# Patient Record
Sex: Female | Born: 1935 | Race: Black or African American | Hispanic: No | Marital: Single | Smoking: Never smoker
Health system: Southern US, Community
[De-identification: ages and names within clinical notes are randomized; demographics above are authoritative.]

## PROBLEM LIST (undated history)

## (undated) DIAGNOSIS — I4891 Unspecified atrial fibrillation: Secondary | ICD-10-CM

## (undated) DIAGNOSIS — N289 Disorder of kidney and ureter, unspecified: Secondary | ICD-10-CM

## (undated) DIAGNOSIS — K219 Gastro-esophageal reflux disease without esophagitis: Secondary | ICD-10-CM

## (undated) DIAGNOSIS — F330 Major depressive disorder, recurrent, mild: Secondary | ICD-10-CM

## (undated) DIAGNOSIS — I10 Essential (primary) hypertension: Secondary | ICD-10-CM

## (undated) DIAGNOSIS — I509 Heart failure, unspecified: Secondary | ICD-10-CM

## (undated) DIAGNOSIS — M1990 Unspecified osteoarthritis, unspecified site: Secondary | ICD-10-CM

## (undated) HISTORY — PX: REPLACEMENT TOTAL KNEE: SUR1224

## (undated) HISTORY — DX: Gastro-esophageal reflux disease without esophagitis: K21.9

## (undated) HISTORY — PX: EYE SURGERY: SHX253

## (undated) HISTORY — DX: Major depressive disorder, recurrent, mild: F33.0

## (undated) HISTORY — PX: TOTAL HIP ARTHROPLASTY: SHX124

## (undated) HISTORY — PX: JOINT REPLACEMENT: SHX530

## (undated) HISTORY — PX: ABDOMINAL HYSTERECTOMY: SHX81

---

## 2005-09-27 ENCOUNTER — Ambulatory Visit (HOSPITAL_COMMUNITY): Admission: RE | Admit: 2005-09-27 | Discharge: 2005-09-28 | Payer: Self-pay | Admitting: Ophthalmology

## 2011-07-18 ENCOUNTER — Inpatient Hospital Stay (HOSPITAL_COMMUNITY)
Admission: EM | Admit: 2011-07-18 | Discharge: 2011-07-22 | DRG: 291 | Disposition: A | Discharge status: Skilled Nursing Facility | Payer: PRIVATE HEALTH INSURANCE | Attending: Internal Medicine | Admitting: Internal Medicine

## 2011-07-18 ENCOUNTER — Encounter (HOSPITAL_COMMUNITY): Payer: Self-pay | Admitting: Emergency Medicine

## 2011-07-18 ENCOUNTER — Emergency Department (HOSPITAL_COMMUNITY): Payer: PRIVATE HEALTH INSURANCE

## 2011-07-18 ENCOUNTER — Other Ambulatory Visit: Payer: Self-pay

## 2011-07-18 DIAGNOSIS — Z96659 Presence of unspecified artificial knee joint: Secondary | ICD-10-CM

## 2011-07-18 DIAGNOSIS — J9801 Acute bronchospasm: Secondary | ICD-10-CM

## 2011-07-18 DIAGNOSIS — J9600 Acute respiratory failure, unspecified whether with hypoxia or hypercapnia: Secondary | ICD-10-CM | POA: Diagnosis present

## 2011-07-18 DIAGNOSIS — R0602 Shortness of breath: Secondary | ICD-10-CM | POA: Diagnosis present

## 2011-07-18 DIAGNOSIS — I4891 Unspecified atrial fibrillation: Secondary | ICD-10-CM | POA: Diagnosis present

## 2011-07-18 DIAGNOSIS — H5440 Blindness, one eye, unspecified eye: Secondary | ICD-10-CM | POA: Diagnosis present

## 2011-07-18 DIAGNOSIS — Z96649 Presence of unspecified artificial hip joint: Secondary | ICD-10-CM

## 2011-07-18 DIAGNOSIS — I509 Heart failure, unspecified: Secondary | ICD-10-CM | POA: Diagnosis present

## 2011-07-18 DIAGNOSIS — I5031 Acute diastolic (congestive) heart failure: Principal | ICD-10-CM | POA: Diagnosis present

## 2011-07-18 DIAGNOSIS — R0600 Dyspnea, unspecified: Secondary | ICD-10-CM

## 2011-07-18 DIAGNOSIS — F068 Other specified mental disorders due to known physiological condition: Secondary | ICD-10-CM | POA: Diagnosis present

## 2011-07-18 DIAGNOSIS — I10 Essential (primary) hypertension: Secondary | ICD-10-CM | POA: Diagnosis present

## 2011-07-18 HISTORY — DX: Unspecified osteoarthritis, unspecified site: M19.90

## 2011-07-18 HISTORY — DX: Essential (primary) hypertension: I10

## 2011-07-18 HISTORY — DX: Unspecified atrial fibrillation: I48.91

## 2011-07-18 HISTORY — DX: Heart failure, unspecified: I50.9

## 2011-07-18 HISTORY — DX: Disorder of kidney and ureter, unspecified: N28.9

## 2011-07-18 LAB — POCT I-STAT TROPONIN I: Troponin i, poc: 0.01 ng/mL (ref 0.00–0.08)

## 2011-07-18 LAB — DIFFERENTIAL
Eosinophils Relative: 6 % — ABNORMAL HIGH (ref 0–5)
Lymphocytes Relative: 20 % (ref 12–46)
Monocytes Absolute: 0.7 10*3/uL (ref 0.1–1.0)
Neutro Abs: 5 10*3/uL (ref 1.7–7.7)
Neutrophils Relative %: 65 % (ref 43–77)

## 2011-07-18 LAB — CBC
MCHC: 33.5 g/dL (ref 30.0–36.0)
MCV: 85.5 fL (ref 78.0–100.0)
RBC: 4.08 MIL/uL (ref 3.87–5.11)
WBC: 7.7 10*3/uL (ref 4.0–10.5)

## 2011-07-18 LAB — COMPREHENSIVE METABOLIC PANEL
AST: 18 U/L (ref 0–37)
Albumin: 3.7 g/dL (ref 3.5–5.2)
BUN: 16 mg/dL (ref 6–23)
GFR calc Af Amer: 62 mL/min — ABNORMAL LOW (ref >90)
Sodium: 133 mEq/L — ABNORMAL LOW (ref 135–145)
Total Protein: 7.7 g/dL (ref 6.0–8.3)

## 2011-07-18 LAB — URINALYSIS, ROUTINE W REFLEX MICROSCOPIC
Bilirubin Urine: NEGATIVE
Glucose, UA: NEGATIVE mg/dL
Leukocytes, UA: NEGATIVE
Urobilinogen, UA: 0.2 mg/dL (ref 0.0–1.0)

## 2011-07-18 LAB — URINE MICROSCOPIC-ADD ON

## 2011-07-18 LAB — PRO B NATRIURETIC PEPTIDE: Pro B Natriuretic peptide (BNP): 613.1 pg/mL — ABNORMAL HIGH (ref 0–450)

## 2011-07-18 MED ORDER — ALBUTEROL (5 MG/ML) CONTINUOUS INHALATION SOLN
10.0000 mg/h | INHALATION_SOLUTION | RESPIRATORY_TRACT | Status: AC
  Administered 2011-07-18: 10 mg/h via RESPIRATORY_TRACT

## 2011-07-18 MED ORDER — IPRATROPIUM BROMIDE 0.02 % IN SOLN
0.5000 mg | Freq: Once | RESPIRATORY_TRACT | Status: AC
  Administered 2011-07-18: 0.5 mg via RESPIRATORY_TRACT

## 2011-07-18 NOTE — ED Notes (Signed)
Pt sent from [facility]. Called EMS to state pt had chest pain. NH gave pt ASA 325mg  and NTG SL .5 x1. Pt tachypnec pon EMS arrival. Pt c/o chest pain to mid sternal area, non-radiating. Rated pain 6/10. Skin warm, dry. Denies nausea. Productive cough on scene with thick, clear mucous. Lung sounds diminished upper bilat and absent in lower bilat. Pt given albuterol 5mg  neb at 1930 and then given second neb of alb 5mg  and atrovent. Pt given Solumedrol 125mg  per EMS. Pt a/o per EMS.

## 2011-07-18 NOTE — ED Provider Notes (Signed)
History     CSN: [id number]  Arrival date & time 07/18/11  2016   First MD Initiated Contact with Patient 07/18/11 2020       Chief Complaint  Patient presents with  . Respiratory Distress   Level V caveat for dementia  (Consider location/radiation/quality/duration/timing/severity/associated sxs/prior treatment) HPI  History obtained from EMS. They relate they were called for patient having chest pain she was noted to have some respiratory distress. They relate to her initial pulse ox was 90% on room air. They also noted her initial blood pressure was 220/100 and time. They were told patient has been having a cough and they've started given her albuterol nebulizer treatments which looking at her sheet from the nursing home was started at least by February 1. She received albuterol nebulizer at the facility and the EMS gave her 2 treatments with Atrovent. She was also given Solu-Medrol 125 mg IV, aspirin 324 mg by mouth and nitroglycerin once. They relate patient has a history of atrial fib no known heart problems or COPD or asthma. He also denies history of congestive heart failure.  Patient had a chest x-ray done on January 18 at the nursing home that was read as showing atelectasis.  PCP Dr. Charlotte Sanes, [facility]  Past Medical History  Diagnosis Date  . Hypertension   . Atrial fibrillation   . Renal disorder    dementia Blind in left eye  History reviewed. No pertinent past surgical history.  No family history on file.  History  Substance Use Topics  . Smoking status: No  . Smokeless tobacco: Not on file  . Alcohol Use: no   patient lives in nursing home, looking at her records she was living at home until December 18  OB History    Grav Para Term Preterm Abortions TAB SAB Ect Mult Living                  Review of Systems  Unable to perform ROS: Dementia    Allergies  Review of patient's allergies indicates no known allergies.  Home Medications    Current Outpatient Rx  Name Route Sig Dispense Refill  . ACETAMINOPHEN 325 MG PO TABS Oral Take 650 mg by mouth every 6 (six) hours as needed. Pain    . ALBUTEROL SULFATE (2.5 MG/3ML) 0.083% IN NEBU Nebulization Take 2.5 mg by nebulization every 6 (six) hours as needed. wheezing    . ALUM & MAG HYDROXIDE-SIMETH 400-400-40 MG/5ML PO SUSP Oral Take 30 mLs by mouth every 6 (six) hours as needed.    . ASPIRIN 325 MG PO TABS Oral Take 325 mg by mouth daily.    Marland Kitchen FLUTICASONE PROPIONATE 50 MCG/ACT NA SUSP Nasal Place 2 sprays into the nose daily.    . GUAIFENESIN-DM 100-10 MG/5ML PO SYRP Oral Take 15 mLs by mouth 3 (three) times daily as needed. Cold symptoms    . LORATADINE 10 MG PO TABS Oral Take 10 mg by mouth daily.    . MELOXICAM 15 MG PO TABS Oral Take 7.5 mg by mouth every 12 (twelve) hours as needed. Pain    . MELOXICAM 15 MG PO TABS Oral Take 15 mg by mouth daily.    Marland Kitchen METOPROLOL TARTRATE 100 MG PO TABS Oral Take 100 mg by mouth daily.    Marland Kitchen OMEPRAZOLE 20 MG PO CPDR Oral Take 20 mg by mouth daily.    Marland Kitchen PROMETHAZINE HCL 25 MG PO TABS Oral Take 25 mg by mouth every  6 (six) hours as needed. Nausea    . TIMOLOL HEMIHYDRATE 0.25 % OP SOLN Right Eye Place 1 drop into the right eye 2 (two) times daily.      BP 158/103  Pulse 86  Temp(Src) 97.5 F (36.4 C) (Core (Comment))  Resp 22  SpO2 98%  Vital signs normal  except hypertension   Physical Exam  Nursing note and vitals reviewed. Constitutional: She is oriented to person, place, and time. She appears well-developed and well-nourished.  Non-toxic appearance. She does not appear ill. She appears distressed.  HENT:  Head: Normocephalic and atraumatic.  Right Ear: External ear normal.  Left Ear: External ear normal.  Nose: Nose normal. No mucosal edema or rhinorrhea.  Mouth/Throat: Oropharynx is clear and moist and mucous membranes are normal. No dental abscesses or uvula swelling.  Eyes: Conjunctivae and EOM are normal. Pupils are  equal, round, and reactive to light.       It eye appears to be something compared to the right. Eye   Neck: Normal range of motion and full passive range of motion without pain. Neck supple.  Cardiovascular: Normal rate, regular rhythm and normal heart sounds.  Exam reveals no gallop and no friction rub.   No murmur heard. Pulmonary/Chest: She is in respiratory distress. She has wheezes. She has no rhonchi. She has no rales. She exhibits no tenderness and no crepitus.       Patient has retractions, she is noted to have diffuse wheezing, she has some cough. Patient indicates she is having pain in her left lower chest wall.  Abdominal: Soft. Normal appearance and bowel sounds are normal. She exhibits no distension. There is no tenderness. There is no rebound and no guarding.  Musculoskeletal: Normal range of motion. She exhibits no edema and no tenderness.       Moves all extremities well.   Neurological: She is alert and oriented to person, place, and time. She has normal strength. No cranial nerve deficit.  Skin: Skin is warm, dry and intact. No rash noted. No erythema. No pallor.  Psychiatric: Her speech is normal and behavior is normal. Her mood appears not anxious.       Affect flat    ED Course  Procedures (including critical care time)  She was placed on BiPAP and started on a continuous nebulizer. She relates she's feeling better. Family has arrived and states she's had a bad cough for the past month. She was living at home but had a fall and has been in rehabilitation since middle of December. She reports that the patient has not been eating or drinking well since she went to the nursing home. Family states patient never smoked. They report she's never had any respiratory problems in the past.  Recheck at 0005 patient has been off BiPAP and is tolerating being off it well. She still has some scattered wheezing however her retractions are much improved.   Medications  albuterol  (PROVENTIL,VENTOLIN) solution continuous neb (10 mg/hr Nebulization New Bag/Given 07/18/11 2038)  furosemide (LASIX) injection 60 mg (not administered)  nitroGLYCERIN (NITROGLYN) 2 % ointment 1 inch (not administered)  ipratropium (ATROVENT) nebulizer solution 0.5 mg (0.5 mg Nebulization Given 07/18/11 2038)  albuterol (PROVENTIL) (5 MG/ML) 0.5% nebulizer solution 5 mg (5 mg Nebulization Given 07/19/11 0016)  ipratropium (ATROVENT) nebulizer solution 0.5 mg (0.5 mg Nebulization Given 07/19/11 0016)    00:22 Dr Conley Rolls admit to tele, team 2 give lasix for CHF   Results for orders placed during  the hospital encounter of 07/18/11  CBC      Component Value Range   WBC 7.7  4.0 - 10.5 (K/uL)   RBC 4.08  3.87 - 5.11 (MIL/uL)   Hemoglobin 11.7 (*) 12.0 - 15.0 (g/dL)   HCT 86.5 (*) 78.4 - 46.0 (%)   MCV 85.5  78.0 - 100.0 (fL)   MCH 28.7  26.0 - 34.0 (pg)   MCHC 33.5  30.0 - 36.0 (g/dL)   RDW 69.6  29.5 - 28.4 (%)   Platelets 243  150 - 400 (K/uL)  DIFFERENTIAL      Component Value Range   Neutrophils Relative 65  43 - 77 (%)   Neutro Abs 5.0  1.7 - 7.7 (K/uL)   Lymphocytes Relative 20  12 - 46 (%)   Lymphs Abs 1.6  0.7 - 4.0 (K/uL)   Monocytes Relative 9  3 - 12 (%)   Monocytes Absolute 0.7  0.1 - 1.0 (K/uL)   Eosinophils Relative 6 (*) 0 - 5 (%)   Eosinophils Absolute 0.4  0.0 - 0.7 (K/uL)   Basophils Relative 0  0 - 1 (%)   Basophils Absolute 0.0  0.0 - 0.1 (K/uL)  COMPREHENSIVE METABOLIC PANEL      Component Value Range   Sodium 133 (*) 135 - 145 (mEq/L)   Potassium 4.0  3.5 - 5.1 (mEq/L)   Chloride 98  96 - 112 (mEq/L)   CO2 26  19 - 32 (mEq/L)   Glucose, Bld 124 (*) 70 - 99 (mg/dL)   BUN 16  6 - 23 (mg/dL)   Creatinine, Ser 1.32  0.50 - 1.10 (mg/dL)   Calcium 9.6  8.4 - 44.0 (mg/dL)   Total Protein 7.7  6.0 - 8.3 (g/dL)   Albumin 3.7  3.5 - 5.2 (g/dL)   AST 18  0 - 37 (U/L)   ALT 16  0 - 35 (U/L)   Alkaline Phosphatase 74  39 - 117 (U/L)   Total Bilirubin 0.2 (*) 0.3 - 1.2  (mg/dL)   GFR calc non Af Amer 53 (*) >90 (mL/min)   GFR calc Af Amer 62 (*) >90 (mL/min)  PRO B NATRIURETIC PEPTIDE      Component Value Range   Pro B Natriuretic peptide (BNP) 613.1 (*) 0 - 450 (pg/mL)  URINALYSIS, ROUTINE W REFLEX MICROSCOPIC      Component Value Range   Color, Urine YELLOW  YELLOW    APPearance CLEAR  CLEAR    Specific Gravity, Urine 1.018  1.005 - 1.030    pH 5.5  5.0 - 8.0    Glucose, UA NEGATIVE  NEGATIVE (mg/dL)   Hgb urine dipstick NEGATIVE  NEGATIVE    Bilirubin Urine NEGATIVE  NEGATIVE    Ketones, ur NEGATIVE  NEGATIVE (mg/dL)   Protein, ur 30 (*) NEGATIVE (mg/dL)   Urobilinogen, UA 0.2  0.0 - 1.0 (mg/dL)   Nitrite NEGATIVE  NEGATIVE    Leukocytes, UA NEGATIVE  NEGATIVE   POCT I-STAT TROPONIN I      Component Value Range   Troponin i, poc 0.01  0.00 - 0.08 (ng/mL)   Comment 3           URINE MICROSCOPIC-ADD ON      Component Value Range   Squamous Epithelial / LPF RARE  RARE    WBC, UA 0-2  <3 (WBC/hpf)   RBC / HPF 0-2  <3 (RBC/hpf)   Bacteria, UA RARE  RARE    Laboratory interpretation  all normal except  mildly elevated BNP, renal insufficiency, hyponatremia  Dg Chest Port 1 View  07/18/2011  *RADIOLOGY REPORT*  Clinical Data: Shortness of breath and weakness.  PORTABLE CHEST - 1 VIEW  Comparison: Chest radiograph performed 09/27/2005  Findings: The lungs are hypoexpanded; vascular crowding and vascular congestion are noted.  Mildly increased interstitial markings could reflect minimal interstitial edema, given the patient's symptoms.  There is no evidence of pleural effusion or pneumothorax.  The cardiomediastinal silhouette is borderline normal in caliber. No acute osseous abnormalities are seen.  IMPRESSION: Lungs hypoexpanded; vascular congestion noted.  Mildly increased interstitial markings could reflect minimal interstitial edema, given the patient's symptoms.  Original Report Authenticated By: Tonia Ghent, M.D.     Date: 07/18/2011  Rate:  81  Rhythm: atrial fibrillation  QRS Axis: normal  Intervals: normal  ST/T Wave abnormalities: normal  Conduction Disutrbances:none  Narrative Interpretation:   Old EKG Reviewed: changes noted from 09/27/2005 SB 45    1. Dyspnea   2. Congestive heart failure   3. Bronchospasm   4. Chest pain     Plan admission  Devoria Albe, MD, FACEP  CRITICAL CARE Performed by: Devoria Albe L   Total critical care time:45 min  Critical care time was exclusive of separately billable procedures and treating other patients.  Critical care was necessary to treat or prevent imminent or life-threatening deterioration.  Critical care was time spent personally by me on the following activities: development of treatment plan with patient and/or surrogate as well as nursing, discussions with consultants, evaluation of patient's response to treatment, examination of patient, obtaining history from patient or surrogate, ordering and performing treatments and interventions, ordering and review of laboratory studies, ordering and review of radiographic studies, pulse oximetry and re-evaluation of patient's condition.   MDM          Ward Givens, MD 07/19/11 [phone]

## 2011-07-18 NOTE — ED Notes (Signed)
RT at bedside. Pt placed on Bi-Pap. Pt tolerating well.

## 2011-07-18 NOTE — ED Notes (Signed)
MD at bedside. Dr. Knapp at bedside.  

## 2011-07-18 NOTE — ED Notes (Signed)
RT called to take pt off Bi-Pap and switch to nasal cannula.

## 2011-07-18 NOTE — ED Notes (Signed)
QMV:HQIO<NG> Expected date:07/18/11<BR> Expected time: 7:57 PM<BR> Means of arrival:Ambulance<BR> Comments:<BR> Respiratory distress, needs respiratory therapy, CHF patient

## 2011-07-19 ENCOUNTER — Encounter (HOSPITAL_COMMUNITY): Payer: Self-pay | Admitting: Neurology

## 2011-07-19 DIAGNOSIS — I359 Nonrheumatic aortic valve disorder, unspecified: Secondary | ICD-10-CM

## 2011-07-19 DIAGNOSIS — I509 Heart failure, unspecified: Secondary | ICD-10-CM | POA: Diagnosis present

## 2011-07-19 LAB — BASIC METABOLIC PANEL
Calcium: 9 mg/dL (ref 8.4–10.5)
Chloride: 95 mEq/L — ABNORMAL LOW (ref 96–112)
Creatinine, Ser: 0.93 mg/dL (ref 0.50–1.10)
GFR calc Af Amer: 63 mL/min — ABNORMAL LOW (ref >90)
Sodium: 133 mEq/L — ABNORMAL LOW (ref 135–145)

## 2011-07-19 LAB — URINE CULTURE

## 2011-07-19 MED ORDER — ENOXAPARIN SODIUM 40 MG/0.4ML ~~LOC~~ SOLN
40.0000 mg | SUBCUTANEOUS | Status: DC
  Administered 2011-07-19 – 2011-07-22 (×4): 40 mg via SUBCUTANEOUS
  Filled 2011-07-19 (×4): qty 0.4

## 2011-07-19 MED ORDER — METOPROLOL TARTRATE 100 MG PO TABS
100.0000 mg | ORAL_TABLET | Freq: Every day | ORAL | Status: DC
  Administered 2011-07-19 – 2011-07-20 (×2): 100 mg via ORAL
  Filled 2011-07-19: qty 4
  Filled 2011-07-19: qty 1

## 2011-07-19 MED ORDER — TIMOLOL MALEATE 0.25 % OP SOLN
1.0000 [drp] | Freq: Two times a day (BID) | OPHTHALMIC | Status: DC
  Administered 2011-07-19 – 2011-07-22 (×7): 1 [drp] via OPHTHALMIC
  Filled 2011-07-19: qty 5

## 2011-07-19 MED ORDER — ALUM & MAG HYDROXIDE-SIMETH 200-200-20 MG/5ML PO SUSP: 30.0000 mL | Freq: Four times a day (QID) | ORAL | Status: DC | PRN

## 2011-07-19 MED ORDER — GUAIFENESIN-DM 100-10 MG/5ML PO SYRP
10.0000 mL | ORAL_SOLUTION | Freq: Four times a day (QID) | ORAL | Status: DC | PRN
  Administered 2011-07-19 – 2011-07-22 (×5): 10 mL via ORAL
  Filled 2011-07-19 (×5): qty 10

## 2011-07-19 MED ORDER — ALBUTEROL SULFATE (5 MG/ML) 0.5% IN NEBU
5.0000 mg | INHALATION_SOLUTION | Freq: Once | RESPIRATORY_TRACT | Status: AC
  Administered 2011-07-19: 5 mg via RESPIRATORY_TRACT
  Filled 2011-07-19: qty 0.5

## 2011-07-19 MED ORDER — ACETAMINOPHEN 325 MG PO TABS
650.0000 mg | ORAL_TABLET | Freq: Four times a day (QID) | ORAL | Status: DC | PRN
  Administered 2011-07-19 – 2011-07-20 (×2): 650 mg via ORAL
  Filled 2011-07-19 (×2): qty 2

## 2011-07-19 MED ORDER — PANTOPRAZOLE SODIUM 40 MG PO TBEC
40.0000 mg | DELAYED_RELEASE_TABLET | Freq: Every day | ORAL | Status: DC
  Administered 2011-07-19 – 2011-07-22 (×4): 40 mg via ORAL
  Filled 2011-07-19 (×4): qty 1

## 2011-07-19 MED ORDER — ASPIRIN 325 MG PO TABS
325.0000 mg | ORAL_TABLET | Freq: Every day | ORAL | Status: DC
  Administered 2011-07-19 – 2011-07-22 (×4): 325 mg via ORAL
  Filled 2011-07-19 (×4): qty 1

## 2011-07-19 MED ORDER — POTASSIUM CHLORIDE CRYS ER 20 MEQ PO TBCR
20.0000 meq | EXTENDED_RELEASE_TABLET | Freq: Every day | ORAL | Status: DC
  Administered 2011-07-19 – 2011-07-22 (×4): 20 meq via ORAL
  Filled 2011-07-19 (×4): qty 1

## 2011-07-19 MED ORDER — ALBUTEROL SULFATE (5 MG/ML) 0.5% IN NEBU
2.5000 mg | INHALATION_SOLUTION | RESPIRATORY_TRACT | Status: DC | PRN
  Administered 2011-07-19 (×2): 2.5 mg via RESPIRATORY_TRACT
  Filled 2011-07-19 (×3): qty 0.5

## 2011-07-19 MED ORDER — IPRATROPIUM BROMIDE 0.02 % IN SOLN
0.5000 mg | Freq: Once | RESPIRATORY_TRACT | Status: AC
  Administered 2011-07-19: 0.5 mg via RESPIRATORY_TRACT
  Filled 2011-07-19: qty 2.5

## 2011-07-19 MED ORDER — FUROSEMIDE 10 MG/ML IJ SOLN
60.0000 mg | Freq: Once | INTRAMUSCULAR | Status: AC
  Administered 2011-07-19: 60 mg via INTRAVENOUS
  Filled 2011-07-19: qty 6

## 2011-07-19 MED ORDER — NITROGLYCERIN 2 % TD OINT
1.0000 [in_us] | TOPICAL_OINTMENT | Freq: Once | TRANSDERMAL | Status: AC
  Administered 2011-07-19: 1 [in_us] via TOPICAL
  Filled 2011-07-19: qty 30

## 2011-07-19 MED ORDER — LISINOPRIL 5 MG PO TABS
5.0000 mg | ORAL_TABLET | Freq: Every day | ORAL | Status: DC
  Administered 2011-07-19 – 2011-07-22 (×4): 5 mg via ORAL
  Filled 2011-07-19 (×4): qty 1

## 2011-07-19 MED ORDER — FUROSEMIDE 10 MG/ML IJ SOLN
40.0000 mg | Freq: Every day | INTRAMUSCULAR | Status: DC
  Administered 2011-07-19 – 2011-07-20 (×2): 40 mg via INTRAVENOUS
  Filled 2011-07-19 (×2): qty 4

## 2011-07-19 NOTE — ED Notes (Signed)
MD at bedside. Dr. Knapp at bedside.  

## 2011-07-19 NOTE — ED Notes (Signed)
RT called for breathing tx. 

## 2011-07-19 NOTE — Plan of Care (Signed)
Problem: Phase I Progression Outcomes Goal: EF % per last Echo/documented,Core Reminder form on chart Outcome: Progressing EF 60% per echo on [address]113

## 2011-07-19 NOTE — Progress Notes (Signed)
PROGRESS NOTE  Shelby Maynard ZOX:[id number] DOB: [birth year] DOA: 07/18/2011 PCP: No primary provider on file.  Brief narrative: 78 year old woman presented with shortness of breath. Complained of chest pain, respiratory distress. Blood pressure 220/100. Initially placed on BiPAP but quickly improved. Chest x-ray revealed some interstitial edema. Patient has been sick for one month with cough and has been treated with rounds of antibiotics and inhalers. She has been expressed a worsening cough at night with PND and orthopnea. Patient denied chest pain to the admitting physician.  Past medical history: Atrial fibrillation (has not been on Coumadin for years), hypertension, glaucoma  Consultants:  None  Procedures:  None  Antibiotics:  None  Interim History: Chart reviewed in detail.  Subjective: Feels better. Breathing better. No chest pain.  Objective: Filed Vitals:   07/19/11 0645 07/19/11 0730 07/19/11 1240 07/19/11 1600  BP: 107/55 121/79 131/80 114/76  Pulse: 85 90 85 89  Temp: 97.4 F (36.3 C) 97.6 F (36.4 C) 97.6 F (36.4 C) 98.5 F (36.9 C)  TempSrc:   Oral Oral  Resp: 8 25 20 22   SpO2: 95% 98% 96% 97%    Intake/Output Summary (Last 24 hours) at 07/19/11 1806 Last data filed at 07/19/11 1710  Gross per 24 hour  Intake    440 ml  Output   3650 ml  Net  -3210 ml    Exam:   General:  Calm and comfortable.  Cardiovascular: Irregular rhythm, normal rate. No murmur, rub, gallop. No significant lower extremity edema  Respiratory: Clear to auscultation bilaterally without wheezes, rales, rhonchi. Normal respiratory effort.  Skin: Appears grossly unremarkable.  Psychiatric: Grossly normal mood and affect. Speech fluent and appropriate.  Data Reviewed: Basic Metabolic Panel:  Lab 07/19/11 8119 07/18/11 2025  NA 133* 133*  K 4.0 4.0  CL 95* 98  CO2 22 26  GLUCOSE 90 124*  BUN 25* 16  CREATININE 0.93 0.94  CALCIUM 9.0 9.6  MG -- --  PHOS -- --    Liver Function Tests:  Lab 07/18/11 2025  AST 18  ALT 16  ALKPHOS 74  BILITOT 0.2*  PROT 7.7  ALBUMIN 3.7   CBC:  Lab 07/18/11 2025  WBC 7.7  NEUTROABS 5.0  HGB 11.7*  HCT 34.9*  MCV 85.5  PLT 243   Studies: Dg Chest Port 1 View  07/18/2011  *RADIOLOGY REPORT*  Clinical Data: Shortness of breath and weakness.  PORTABLE CHEST - 1 VIEW  Comparison: Chest radiograph performed 09/27/2005  Findings: The lungs are hypoexpanded; vascular crowding and vascular congestion are noted.  Mildly increased interstitial markings could reflect minimal interstitial edema, given the patient's symptoms.  There is no evidence of pleural effusion or pneumothorax.  The cardiomediastinal silhouette is borderline normal in caliber. No acute osseous abnormalities are seen.  IMPRESSION: Lungs hypoexpanded; vascular congestion noted.  Mildly increased interstitial markings could reflect minimal interstitial edema, given the patient's symptoms.  Original Report Authenticated By: Tonia Ghent, M.D.    Scheduled Meds:   . albuterol  5 mg Nebulization Once  . aspirin  325 mg Oral Daily  . enoxaparin (LOVENOX) injection  40 mg Subcutaneous Q24H  . furosemide  40 mg Intravenous Daily  . furosemide  60 mg Intravenous Once  . ipratropium  0.5 mg Nebulization Once  . ipratropium  0.5 mg Nebulization Once  . lisinopril  5 mg Oral Daily  . metoprolol  100 mg Oral Daily  . nitroGLYCERIN  1 inch Topical Once  . pantoprazole  40  mg Oral Q1200  . potassium chloride  20 mEq Oral Daily  . timolol  1 drop Right Eye BID   Continuous Infusions:   . albuterol Stopped (07/19/11 1610)     Assessment/Plan: 1. Acute respiratory failure: Resolved. History in response to Lasix is most suggestive of acute diastolic heart failure given cardiac imaging. 2. Acute diastolic (presumed) heart failure: Much improved. Continue Lasix. Continue beta blocker. 3. Atrial fibrillation: Rate control. Continue beta blocker. Patient  has not been on warfarin for many years. Followup as outpatient.  Code Status: Full code. Family Communication: Discussed with family at bedside including granddaughter. All questions answered to her apparent satisfaction. Disposition Plan: Return to skilled nursing facility when improved, possibly 24-48 hours.   Brendia Sacks, MD  [facility] Pager [phone] 07/19/2011, 6:06 PM    LOS: 1 day

## 2011-07-19 NOTE — Progress Notes (Signed)
*  PRELIMINARY RESULTS* Echocardiogram 2D Echocardiogram has been performed.  Glean Salen [facility] 07/19/2011, 9:58 AM

## 2011-07-19 NOTE — H&P (Signed)
Shelby Maynard is an 78 y.o. female with a PMHX significant for Afib with controlled rate, HTN, blindness in OS 2/2 glaucoma, DJD, and recent fall. She has not been on coumadin in years.   Chief Complaint: respiratory difficulty, cough, SOB x one month HPI: Shelby Maynard presented via EMS to [facility] ED tonight after experiencing significant SOB with respiratory distress at the rehab facility where she is residing short term after a fall in Dec 2012. Upon arrival, pt was given neb tx and placed on Bipap with relief of her respiratory distress. She is presently tolerating being off bipap. She was also given Solumedrol IV, NTG, and ASA 325 x 1. Troponin neg in ED. EKG showed Afib with normal rate and no acute changes. CXR with increased markings and vascular congestion with ? Interstitial edema. Other labs and MD ED note reviewed.  Hx and ROS per pt and daughter/granddaughter who are at bedside. Pt/family relate that pt has had a cough with production of clear "phlegm" for one month at the rehab facility. She has had abx, inhalers and nebs without relief of her sx. She had a CXR on 06/15/11 which showed atelectasis. Pt says she has been experiencing worsening cough at night with PND and orthopnea worsening over the past week or so. Family asked for pt to be sent here for evaluation due to the length of acute illness. Pt denies fever, chills, vomiting, chest pain, abd pain, or change in mental status. She admits to some nausea and heartburn lately and says her appetite hasn't been that great since being at the SNF for rehab. She and family deny any history of heart problems or lung problems.  Prior to Dec 2012 and the mechanical fall, she lived alone. However, she did have outside help come in a few hours per day 5 x a week for assistance. Family rotated covering the other gaps in time. Family denies any hx of dementia and says mom is "sharp as a tack".  Pt has had debility 2/2 DJD with hx of several joint replacements. Says her  knee "gives out" sometimes b/c she was supposed to have it replaced, but elected not to do this. Pt doesn't drive.  Therefore, because of the pt's sx, we are asked to see, evaluate, and admit to hospital for treatment.  Past Medical History  Diagnosis Date  . Hypertension   . Atrial fibrillation   . Renal disorder   . Arthritis     Past Surgical History  Procedure Date  . Replacement total knee   . Total hip arthroplasty     bilateral  . Eye surgery   . Abdominal hysterectomy   . Joint replacement    Social History: Pt presently lives at [facility] for rehab after a fall in Dec 2012. Prior to that she lived alone. Remote hx of social ETOH but none in years. Never smoked. No illicit drugs.  FMHX: Father died of MI. Mother died of colon cancer. Daughters x2 died of breast cancer and lung cancer.  Allergies: No Known Allergies  Medications Prior to Admission  Medication Dose Route Frequency Provider Last Rate Last Dose  . albuterol (PROVENTIL) (5 MG/ML) 0.5% nebulizer solution 5 mg  5 mg Nebulization Once Ward Givens, MD   5 mg at 07/19/11 0016  . albuterol (PROVENTIL,VENTOLIN) solution continuous neb  10 mg/hr Nebulization Continuous Ward Givens, MD 2 mL/hr at 07/18/11 2038 10 mg/hr at 07/18/11 2038  . furosemide (LASIX) injection 60 mg  60 mg  Intravenous Once Ward Givens, MD      . ipratropium (ATROVENT) nebulizer solution 0.5 mg  0.5 mg Nebulization Once Ward Givens, MD   0.5 mg at 07/18/11 2038  . ipratropium (ATROVENT) nebulizer solution 0.5 mg  0.5 mg Nebulization Once Ward Givens, MD   0.5 mg at 07/19/11 0016  . nitroGLYCERIN (NITROGLYN) 2 % ointment 1 inch  1 inch Topical Once Ward Givens, MD       No current outpatient prescriptions on file as of 07/18/2011.    Results for orders placed during the hospital encounter of 07/18/11 (from the past 48 hour(s))  CBC     Status: Abnormal   Collection Time   07/18/11  8:25 PM      Component Value Range Comment   WBC 7.7  4.0 - 10.5  (K/uL)    RBC 4.08  3.87 - 5.11 (MIL/uL)    Hemoglobin 11.7 (*) 12.0 - 15.0 (g/dL)    HCT 16.1 (*) 09.6 - 46.0 (%)    MCV 85.5  78.0 - 100.0 (fL)    MCH 28.7  26.0 - 34.0 (pg)    MCHC 33.5  30.0 - 36.0 (g/dL)    RDW 04.5  40.9 - 81.1 (%)    Platelets 243  150 - 400 (K/uL)   DIFFERENTIAL     Status: Abnormal   Collection Time   07/18/11  8:25 PM      Component Value Range Comment   Neutrophils Relative 65  43 - 77 (%)    Neutro Abs 5.0  1.7 - 7.7 (K/uL)    Lymphocytes Relative 20  12 - 46 (%)    Lymphs Abs 1.6  0.7 - 4.0 (K/uL)    Monocytes Relative 9  3 - 12 (%)    Monocytes Absolute 0.7  0.1 - 1.0 (K/uL)    Eosinophils Relative 6 (*) 0 - 5 (%)    Eosinophils Absolute 0.4  0.0 - 0.7 (K/uL)    Basophils Relative 0  0 - 1 (%)    Basophils Absolute 0.0  0.0 - 0.1 (K/uL)   COMPREHENSIVE METABOLIC PANEL     Status: Abnormal   Collection Time   07/18/11  8:25 PM      Component Value Range Comment   Sodium 133 (*) 135 - 145 (mEq/L)    Potassium 4.0  3.5 - 5.1 (mEq/L)    Chloride 98  96 - 112 (mEq/L)    CO2 26  19 - 32 (mEq/L)    Glucose, Bld 124 (*) 70 - 99 (mg/dL)    BUN 16  6 - 23 (mg/dL)    Creatinine, Ser 9.14  0.50 - 1.10 (mg/dL)    Calcium 9.6  8.4 - 10.5 (mg/dL)    Total Protein 7.7  6.0 - 8.3 (g/dL)    Albumin 3.7  3.5 - 5.2 (g/dL)    AST 18  0 - 37 (U/L)    ALT 16  0 - 35 (U/L)    Alkaline Phosphatase 74  39 - 117 (U/L)    Total Bilirubin 0.2 (*) 0.3 - 1.2 (mg/dL)    GFR calc non Af Amer 53 (*) >90 (mL/min)    GFR calc Af Amer 62 (*) >90 (mL/min)   PRO B NATRIURETIC PEPTIDE     Status: Abnormal   Collection Time   07/18/11  8:25 PM      Component Value Range Comment   Pro B Natriuretic peptide (BNP) 613.1 (*)  0 - 450 (pg/mL)   URINALYSIS, ROUTINE W REFLEX MICROSCOPIC     Status: Abnormal   Collection Time   07/18/11  8:34 PM      Component Value Range Comment   Color, Urine YELLOW  YELLOW     APPearance CLEAR  CLEAR     Specific Gravity, Urine 1.018  1.005 - 1.030      pH 5.5  5.0 - 8.0     Glucose, UA NEGATIVE  NEGATIVE (mg/dL)    Hgb urine dipstick NEGATIVE  NEGATIVE     Bilirubin Urine NEGATIVE  NEGATIVE     Ketones, ur NEGATIVE  NEGATIVE (mg/dL)    Protein, ur 30 (*) NEGATIVE (mg/dL)    Urobilinogen, UA 0.2  0.0 - 1.0 (mg/dL)    Nitrite NEGATIVE  NEGATIVE     Leukocytes, UA NEGATIVE  NEGATIVE    URINE MICROSCOPIC-ADD ON     Status: Normal   Collection Time   07/18/11  8:34 PM      Component Value Range Comment   Squamous Epithelial / LPF RARE  RARE     WBC, UA 0-2  <3 (WBC/hpf)    RBC / HPF 0-2  <3 (RBC/hpf)    Bacteria, UA RARE  RARE    POCT I-STAT TROPONIN I     Status: Normal   Collection Time   07/18/11  8:50 PM      Component Value Range Comment   Troponin i, poc 0.01  0.00 - 0.08 (ng/mL)    Comment 3             Dg Chest Port 1 View  07/18/2011  *RADIOLOGY REPORT*  Clinical Data: Shortness of breath and weakness.  PORTABLE CHEST - 1 VIEW  Comparison: Chest radiograph performed 09/27/2005  Findings: The lungs are hypoexpanded; vascular crowding and vascular congestion are noted.  Mildly increased interstitial markings could reflect minimal interstitial edema, given the patient's symptoms.  There is no evidence of pleural effusion or pneumothorax.  The cardiomediastinal silhouette is borderline normal in caliber. No acute osseous abnormalities are seen.  IMPRESSION: Lungs hypoexpanded; vascular congestion noted.  Mildly increased interstitial markings could reflect minimal interstitial edema, given the patient's symptoms.  Original Report Authenticated By: Tonia Ghent, M.D.    Review of Systems  Constitutional: Negative for fever, chills, weight loss, malaise/fatigue and diaphoresis.  HENT: Negative for hearing loss, congestion, sore throat and ear discharge.   Eyes: Negative for double vision, pain and discharge.       Blind OS  Respiratory: Positive for cough, sputum production (clear), shortness of breath and wheezing. Negative for  hemoptysis and stridor.   Cardiovascular: Positive for orthopnea, leg swelling and PND. Negative for chest pain and palpitations.  Gastrointestinal: Positive for heartburn and nausea. Negative for vomiting, abdominal pain and diarrhea.  Genitourinary: Negative for dysuria and hematuria.  Musculoskeletal: Positive for joint pain and falls (one in December. "leg gave out").  Skin: Negative for rash.  Neurological: Positive for weakness (in knee from arthritis). Negative for dizziness, tremors, sensory change, speech change, focal weakness, seizures, loss of consciousness and headaches.  Endo/Heme/Allergies: Negative for polydipsia. Does not bruise/bleed easily.  Psychiatric/Behavioral: Negative for depression, memory loss and substance abuse. The patient is not nervous/anxious.     Blood pressure 158/103, pulse 86, temperature 97.5 F (36.4 C), temperature source Core (Comment), resp. rate 22, SpO2 98.00%. Physical Exam  Vitals reviewed. Constitutional: She is oriented to person, place, and time. She appears well-developed and well-nourished.  No distress.       obese  HENT:  Head: Normocephalic and atraumatic.  Right Ear: External ear normal.  Left Ear: External ear normal.  Nose: Nose normal.  Mouth/Throat: Oropharynx is clear and moist. No oropharyngeal exudate.  Eyes: Conjunctivae are normal. Right eye exhibits no discharge. Left eye exhibits no discharge. No scleral icterus.       OD pupil 3mm and reactive. Non reactive OS pupil 2/2 blindness.   Neck: Normal range of motion. Neck supple. No JVD present. No tracheal deviation present. No thyromegaly present.  Cardiovascular: Normal rate and intact distal pulses.  Exam reveals no gallop and no friction rub.   No murmur heard.      No carotid bruits  Respiratory: Effort normal. No stridor. No respiratory distress. She has wheezes (minor but good air exchange). She has no rales.  GI: Soft. Bowel sounds are normal. She exhibits no  distension and no mass. There is no tenderness. There is no rebound and no guarding.  Musculoskeletal: Normal range of motion.       No redness, heat or edema noted to joints  Lymphadenopathy:    She has no cervical adenopathy.  Neurological: She is alert and oriented to person, place, and time. No cranial nerve deficit.       Keeps her eyes closed some but is easily aroused and talkative  Skin: Skin is warm and dry. She is not diaphoretic. No erythema.  Psychiatric: She has a normal mood and affect. Her behavior is normal. Judgment and thought content normal.    A/P: 1. New onset CHF-We will admit her to telemetry and get an echo in the a.m.  Cardio consult if needed. Follow BNP. Lasix IV and KCL supplementation. She is already on BB and will add ACE.  2. resp distress upon arrival-resolved with Bipap. Wheezing is less. Will add nebs for comfort although wheezing is likely cardiac in origin. O2 as needed to keep sats over 92%. Pt not having chest pain and troponin was neg. Not suspicious for PE at this time.  3. HTN-BP up some. On metroprolol chronically, add ACE as in #1. Watch renal fx. 4. Afib with controlled rate-family says she hasn't been on Coumadin in years. On ASA. CHADs score  at 3 but do not know reason for no Coumadin now, likely her falls.  5. Advanced directives-discussed with pt. She wishes to be a FULL CODE.  6. PPX-Lovenox and Protonix.   Dr. Conley Rolls saw pt as well and he agrees with treatment plan.  KIRBY, Mayu Ronk 07/19/2011, 1:33 AM

## 2011-07-19 NOTE — ED Notes (Signed)
Pt began having coughing spell. Also noted wheezing, congestion. RT called to give prn breathing tx. Pt medicated for cough and pain in legs.

## 2011-07-19 NOTE — H&P (Signed)
I have seen her along with Ms Craige Cotta and I agree with her excellent and thorough evaluation of this patient.  I do believe her wheezing is primarily that of cardiac wheezes rather than pulmonary.  This is evidence with a rapid improvement in Tx for CHF, where as prior tx for asthma or brochospasm was not helpful.  Agree with the planned work up as well.

## 2011-07-19 NOTE — ED Notes (Signed)
Care of pt assumed. Pt denies any sob, sts feels "alright." Pt voices no needs or concerns at this time. Will continue to monitor.

## 2011-07-20 LAB — PRO B NATRIURETIC PEPTIDE: Pro B Natriuretic peptide (BNP): 513.3 pg/mL — ABNORMAL HIGH (ref 0–450)

## 2011-07-20 MED ORDER — FUROSEMIDE 40 MG PO TABS
60.0000 mg | ORAL_TABLET | Freq: Every day | ORAL | Status: DC
  Administered 2011-07-21 – 2011-07-22 (×2): 60 mg via ORAL
  Filled 2011-07-20 (×3): qty 1

## 2011-07-20 MED ORDER — ALBUTEROL SULFATE (5 MG/ML) 0.5% IN NEBU
5.0000 mg | INHALATION_SOLUTION | RESPIRATORY_TRACT | Status: AC
  Administered 2011-07-20 (×2): 5 mg via RESPIRATORY_TRACT
  Filled 2011-07-20 (×2): qty 0.5

## 2011-07-20 MED ORDER — ONDANSETRON HCL 4 MG PO TABS
4.0000 mg | ORAL_TABLET | Freq: Three times a day (TID) | ORAL | Status: DC | PRN
  Administered 2011-07-20 – 2011-07-22 (×2): 4 mg via ORAL
  Filled 2011-07-20 (×2): qty 1

## 2011-07-20 MED ORDER — METOPROLOL TARTRATE 50 MG PO TABS
50.0000 mg | ORAL_TABLET | Freq: Two times a day (BID) | ORAL | Status: DC
  Administered 2011-07-21 – 2011-07-22 (×4): 50 mg via ORAL
  Filled 2011-07-20 (×6): qty 1

## 2011-07-20 NOTE — Progress Notes (Signed)
CSW spoke with patient's daughter, Darice Vicario (cell#: [phone] home#: [phone]) re: discharge planning. Patient had been a resident of [facility] & plans to return there when medically stable. FL2 completed, CSW confirmed with [facility] [employer] that she is able to return over the weekend if ready.   Unice Bailey, LCSWA [phone]

## 2011-07-20 NOTE — Progress Notes (Signed)
CARE MANAGEMENT NOTE 07/20/2011  Patient:  Shelby Maynard, Shelby Maynard   Account Number:  [id number]  Date Initiated:  07/20/2011  Documentation initiated by:  Savio Albrecht  Subjective/Objective Assessment:   new onset of heart failure     Action/Plan:   lives at [facility]   Anticipated DC Date:  07/23/2011   Anticipated DC Plan:  SKILLED NURSING FACILITY  In-house referral  Clinical Social Worker      DC Planning Services  NA      [facility] Choice  NA   Choice offered to / List presented to:  NA   DME arranged  NA      DME agency  NA     HH arranged  NA      HH agency  NA   Status of service:  In process, will continue to follow Medicare Important Message given?  NA - LOS <3 / Initial given by admissions (If response is "NO", the following Medicare IM given date fields will be blank) Date Medicare IM given:   Date Additional Medicare IM given:    Discharge Disposition:    Per UR Regulation:  Reviewed for med. necessity/level of care/duration of stay  Comments:  [id number]/Dajanee Voorheis,RN,BSN,CCM

## 2011-07-20 NOTE — Progress Notes (Signed)
Patient seen and examined. Data reviewed. Agree with exam, assessment and plan.

## 2011-07-20 NOTE — Progress Notes (Signed)
Subjective: "I feel sick in my stomach". Denies SOB. Reports sleeping well last night.   Objective: Vital signs Filed Vitals:   07/19/11 1842 07/19/11 2037 07/20/11 0542 07/20/11 0732  BP: 111/70  121/81   Pulse: 79  67   Temp: 98.2 F (36.8 C)  97.6 F (36.4 C)   TempSrc: Oral  Oral   Resp: 20  18   Height: 5\' 7"  (1.702 m)     Weight: 106.595 kg (235 lb)     SpO2: 99% 98% 100% 100%   Weight change:  Last BM Date: 07/19/11  Intake/Output from previous day: 02/21 0701 - 02/22 0700 In: 440 [P.O.:440] Out: 2000 [Urine:2000]     Physical Exam: General: Alert, awake, oriented x3, in no acute distress. HEENT: No bruits, no goiter. Heart: Irregular rhythm but regular rate. No murmurs, rubs, gallops. No LEE Lungs:Normal effort. Breath sounds somewhat distant but clear to auscultation bilaterally. No wheeze Abdomen: Soft, nontender, nondistended, positive bowel sounds. Extremities: No clubbing cyanosis or edema with positive pedal pulses. Neuro: Grossly intact, nonfocal.    Lab Results: Basic Metabolic Panel:  Basename 07/19/11 1230 07/18/11 2025  NA 133* 133*  K 4.0 4.0  CL 95* 98  CO2 22 26  GLUCOSE 90 124*  BUN 25* 16  CREATININE 0.93 0.94  CALCIUM 9.0 9.6  MG -- --  PHOS -- --   Liver Function Tests:  Basename 07/18/11 2025  AST 18  ALT 16  ALKPHOS 74  BILITOT 0.2*  PROT 7.7  ALBUMIN 3.7   No results found for this basename: LIPASE:2,AMYLASE:2 in the last 72 hours No results found for this basename: AMMONIA:2 in the last 72 hours CBC:  Basename 07/18/11 2025  WBC 7.7  NEUTROABS 5.0  HGB 11.7*  HCT 34.9*  MCV 85.5  PLT 243   Cardiac Enzymes: No results found for this basename: CKTOTAL:3,CKMB:3,CKMBINDEX:3,TROPONINI:3 in the last 72 hours BNP:  Basename 07/20/11 0457 07/18/11 2025  PROBNP 513.3* 613.1*   D-Dimer: No results found for this basename: DDIMER:2 in the last 72 hours CBG: No results found for this basename: GLUCAP:6 in the last  72 hours Hemoglobin A1C: No results found for this basename: HGBA1C in the last 72 hours Fasting Lipid Panel: No results found for this basename: CHOL,HDL,LDLCALC,TRIG,CHOLHDL,LDLDIRECT in the last 72 hours Thyroid Function Tests: No results found for this basename: TSH,T4TOTAL,FREET4,T3FREE,THYROIDAB in the last 72 hours Anemia Panel: No results found for this basename: VITAMINB12,FOLATE,FERRITIN,TIBC,IRON,RETICCTPCT in the last 72 hours Coagulation: No results found for this basename: LABPROT:2,INR:2 in the last 72 hours Urine Drug Screen: Drugs of Abuse  No results found for this basename: labopia, cocainscrnur, labbenz, amphetmu, thcu, labbarb    Alcohol Level: No results found for this basename: ETH:2 in the last 72 hours Urinalysis:  Basename 07/18/11 2034  COLORURINE YELLOW  LABSPEC 1.018  PHURINE 5.5  GLUCOSEU NEGATIVE  HGBUR NEGATIVE  BILIRUBINUR NEGATIVE  KETONESUR NEGATIVE  PROTEINUR 30*  UROBILINOGEN 0.2  NITRITE NEGATIVE  LEUKOCYTESUR NEGATIVE   Misc. Labs:  Recent Results (from the past 240 hour(s))  URINE CULTURE     Status: Normal   Collection Time   07/18/11  8:34 PM      Component Value Range Status Comment   Specimen Description URINE, CATHETERIZED   Final    Special Requests Normal   Final    Culture  Setup Time [id number]   Final    Colony Count NO GROWTH   Final    Culture NO GROWTH  Final    Report Status 07/19/2011 FINAL   Final   CULTURE, BLOOD (ROUTINE X 2)     Status: Normal (Preliminary result)   Collection Time   07/18/11  8:40 PM      Component Value Range Status Comment   Specimen Description BLOOD RIGHT ARM   Final    Special Requests BOTTLES DRAWN AEROBIC AND ANAEROBIC 5CC   Final    Culture  Setup Time [id number]   Final    Culture     Final    Value:        BLOOD CULTURE RECEIVED NO GROWTH TO DATE CULTURE WILL BE HELD FOR 5 DAYS BEFORE ISSUING A FINAL NEGATIVE REPORT   Report Status PENDING   Incomplete   CULTURE, BLOOD  (ROUTINE X 2)     Status: Normal (Preliminary result)   Collection Time   07/18/11  8:45 PM      Component Value Range Status Comment   Specimen Description BLOOD RIGHT ARM   Final    Special Requests BOTTLES DRAWN AEROBIC AND ANAEROBIC 5CC   Final    Culture  Setup Time [id number]   Final    Culture     Final    Value:        BLOOD CULTURE RECEIVED NO GROWTH TO DATE CULTURE WILL BE HELD FOR 5 DAYS BEFORE ISSUING A FINAL NEGATIVE REPORT   Report Status PENDING   Incomplete     Studies/Results: Dg Chest Port 1 View  07/18/2011  *RADIOLOGY REPORT*  Clinical Data: Shortness of breath and weakness.  PORTABLE CHEST - 1 VIEW  Comparison: Chest radiograph performed 09/27/2005  Findings: The lungs are hypoexpanded; vascular crowding and vascular congestion are noted.  Mildly increased interstitial markings could reflect minimal interstitial edema, given the patient's symptoms.  There is no evidence of pleural effusion or pneumothorax.  The cardiomediastinal silhouette is borderline normal in caliber. No acute osseous abnormalities are seen.  IMPRESSION: Lungs hypoexpanded; vascular congestion noted.  Mildly increased interstitial markings could reflect minimal interstitial edema, given the patient's symptoms.  Original Report Authenticated By: Tonia Ghent, M.D.    Medications: Scheduled Meds:   . albuterol  5 mg Nebulization Q4H  . aspirin  325 mg Oral Daily  . enoxaparin (LOVENOX) injection  40 mg Subcutaneous Q24H  . furosemide  40 mg Intravenous Daily  . lisinopril  5 mg Oral Daily  . metoprolol  100 mg Oral Daily  . pantoprazole  40 mg Oral Q1200  . potassium chloride  20 mEq Oral Daily  . timolol  1 drop Right Eye BID   Continuous Infusions:  PRN Meds:.acetaminophen, albuterol, alum & mag hydroxide-simeth, guaiFENesin-dextromethorphan, ondansetron  Assessment/Plan:  Principal Problem:  *Congestive heart failure Assessment/Plan:  1. Acute respiratory failure: Resolved. Response  to Lasix is most suggestive of acute diastolic heart failure given cardiac imaging. Will change to po. Sats 97-100 on 2L. Will wean oxygen. Will check sats with activity.  2. Acute diastolic (presumed) heart failure: Continues to improve. Continue Lasi but change to po. Continue beta blocker. 3. Atrial fibrillation: Rate control. Continue beta blocker. Patient has not been on warfarin for many years. Followup as outpatient 4. HTN: controlled.  5. Mild hyponatremia likely due to diuresing. Will monitor.    LOS: 2 days   [facility] M 07/20/2011, 10:54 AM

## 2011-07-20 NOTE — Evaluation (Signed)
Physical Therapy Evaluation Patient Details Name: Shelby Maynard MRN: [id number] DOB: [birth year] Today's Date: 07/20/2011  Problem List:  Patient Active Problem List  Diagnoses  . Congestive heart failure    Past Medical History:  Past Medical History  Diagnosis Date  . Hypertension   . Atrial fibrillation   . Renal disorder   . Arthritis   . CHF (congestive heart failure)    Past Surgical History:  Past Surgical History  Procedure Date  . Replacement total knee   . Total hip arthroplasty     bilateral  . Eye surgery   . Abdominal hysterectomy   . Joint replacement     PT Assessment/Plan/Recommendation PT Assessment Clinical Impression Statement: Pt presents with diagnosis of acute resp failure. Pt admitted from SNF and plans to return. Pt will benefit from skilled PT in acute setting to improve strength, activity tolerance, and gait in preparation for D/c back to [facility] for continued rehab PT Recommendation/Assessment: Patient will need skilled PT in the acute care venue PT Problem List: Decreased strength;Decreased activity tolerance;Decreased mobility;Decreased knowledge of use of DME PT Therapy Diagnosis : Difficulty walking;Generalized weakness PT Plan PT Frequency: Min 2X/week PT Treatment/Interventions: Gait training;DME instruction;Functional mobility training;Therapeutic activities;Therapeutic exercise;Patient/family education PT Recommendation Follow Up Recommendations: Skilled nursing facility Equipment Recommended: Defer to next venue PT Goals  Acute Rehab PT Goals PT Goal Formulation: With patient/family Time For Goal Achievement: 7 days Pt will go Supine/Side to Sit: with min assist PT Goal: Supine/Side to Sit - Progress: Goal set today Pt will go Sit to Supine/Side: with min assist PT Goal: Sit to Supine/Side - Progress: Goal set today Pt will go Sit to Stand: with min assist PT Goal: Sit to Stand - Progress: Goal set today Pt will Ambulate: 16 - 50  feet;with mod assist;with rolling walker PT Goal: Ambulate - Progress: Goal set today  PT Evaluation Precautions/Restrictions  Precautions Precautions: Fall Prior Functioning  Home Living Type of Home: Skilled Nursing Facility   Cognition Cognition Arousal/Alertness: Awake/alert Overall Cognitive Status: Appears within functional limits for tasks assessed Orientation Level: Oriented X4 Sensation/Coordination Sensation Light Touch: Appears Intact Coordination Gross Motor Movements are Fluid and Coordinated: Yes Extremity Assessment RLE Strength RLE Overall Strength Comments: Grossly 3+5. LLE Strength LLE Overall Strength Comments: grossly 3+/5 Mobility (including Balance) Bed Mobility Bed Mobility: Yes Supine to Sit: 4: Min assist;HOB elevated (Comment degrees) Supine to Sit Details (indicate cue type and reason): VCs safety, technique. Assist for trunk to upright Transfers Transfers: Yes Sit to Stand: 2: Max assist;From bed;From elevated surface;With upper extremity assist Sit to Stand Details (indicate cue type and reason): VCs safety, technique, hand placement. Assist to rise, stabilize Stand to Sit: 3: Mod assist;To chair/3-in-1;With armrests;With upper extremity assist Stand to Sit Details: VCs safety, technique, hand placement. Assist to control descent.  Ambulation/Gait Ambulation/Gait: Yes Ambulation/Gait Assistance: 1: +2 Total assist (Pt=75%) Ambulation/Gait Assistance Details (indicate cue type and reason): +2 safety. followed with recliner. VCs safety. Assist to steady. Pt fatigues easily.  Ambulation Distance (Feet): 25 Feet Assistive device: Rolling walker Gait Pattern: Step-through pattern;Decreased step length - right;Decreased step length - left;Decreased stride length;Trunk flexed    Exercise    End of Session PT - End of Session Equipment Utilized During Treatment: Gait belt Activity Tolerance: Patient tolerated treatment well Patient left: in  chair;with call bell in reach;with family/visitor present Nurse Communication: Mobility status for transfers General Behavior During Session: [facility] for tasks performed Cognition: [facility] for tasks performed  Shelby Alert  Maynard 07/20/2011, 4:33 PM [phone]

## 2011-07-21 LAB — BASIC METABOLIC PANEL
CO2: 31 mEq/L (ref 19–32)
Calcium: 9.6 mg/dL (ref 8.4–10.5)
Creatinine, Ser: 1.01 mg/dL (ref 0.50–1.10)
GFR calc non Af Amer: 49 mL/min — ABNORMAL LOW (ref >90)
Glucose, Bld: 94 mg/dL (ref 70–99)
Sodium: 137 mEq/L (ref 135–145)

## 2011-07-21 NOTE — Progress Notes (Signed)
Subjective: "I feel much better today". Reports sleeping well. Denies pain/discomfort. Sitting up in chair talking on phone.  Objective: Vital signs Filed Vitals:   07/20/11 2134 07/21/11 0152 07/21/11 0550 07/21/11 0755  BP: 119/77 114/74 112/61   Pulse: 75 80 80   Temp: 98.1 F (36.7 C)  98 F (36.7 C)   TempSrc: Oral  Oral   Resp: 18  18   Height:      Weight:    101.152 kg (223 lb)  SpO2: 97%  97%    Weight change:  Last BM Date: 07/19/11  Intake/Output from previous day: 02/22 0701 - 02/23 0700 In: 680 [P.O.:680] Out: 1350 [Urine:1350]     Physical Exam: General: Alert, awake, oriented x3, in no acute distress. Up in chair, smiling HEENT: No bruits, no goiter. Heart: Irregular rhythm regular rate without murmurs, rubs, gallops. Lungs: Normal effort. Breath sounds clear to auscultation bilaterally. No wheeze, rales Abdomen: Soft, nontender, nondistended, positive bowel sounds. Extremities: No clubbing cyanosis or edema with positive pedal pulses. Neuro: Grossly intact, nonfocal. Speech clear    Lab Results: Basic Metabolic Panel:  Basename 07/21/11 0528 07/19/11 1230  NA 137 133*  K 3.6 4.0  CL 97 95*  CO2 31 22  GLUCOSE 94 90  BUN 20 25*  CREATININE 1.01 0.93  CALCIUM 9.6 9.0  MG -- --  PHOS -- --   Liver Function Tests:  Basename 07/18/11 2025  AST 18  ALT 16  ALKPHOS 74  BILITOT 0.2*  PROT 7.7  ALBUMIN 3.7   No results found for this basename: LIPASE:2,AMYLASE:2 in the last 72 hours No results found for this basename: AMMONIA:2 in the last 72 hours CBC:  Basename 07/18/11 2025  WBC 7.7  NEUTROABS 5.0  HGB 11.7*  HCT 34.9*  MCV 85.5  PLT 243   Cardiac Enzymes: No results found for this basename: CKTOTAL:3,CKMB:3,CKMBINDEX:3,TROPONINI:3 in the last 72 hours BNP:  Basename 07/20/11 0457 07/18/11 2025  PROBNP 513.3* 613.1*   D-Dimer: No results found for this basename: DDIMER:2 in the last 72 hours CBG: No results found for this  basename: GLUCAP:6 in the last 72 hours Hemoglobin A1C: No results found for this basename: HGBA1C in the last 72 hours Fasting Lipid Panel: No results found for this basename: CHOL,HDL,LDLCALC,TRIG,CHOLHDL,LDLDIRECT in the last 72 hours Thyroid Function Tests: No results found for this basename: TSH,T4TOTAL,FREET4,T3FREE,THYROIDAB in the last 72 hours Anemia Panel: No results found for this basename: VITAMINB12,FOLATE,FERRITIN,TIBC,IRON,RETICCTPCT in the last 72 hours Coagulation: No results found for this basename: LABPROT:2,INR:2 in the last 72 hours Urine Drug Screen: Drugs of Abuse  No results found for this basename: labopia, cocainscrnur, labbenz, amphetmu, thcu, labbarb    Alcohol Level: No results found for this basename: ETH:2 in the last 72 hours Urinalysis:  Basename 07/18/11 2034  COLORURINE YELLOW  LABSPEC 1.018  PHURINE 5.5  GLUCOSEU NEGATIVE  HGBUR NEGATIVE  BILIRUBINUR NEGATIVE  KETONESUR NEGATIVE  PROTEINUR 30*  UROBILINOGEN 0.2  NITRITE NEGATIVE  LEUKOCYTESUR NEGATIVE   Misc. Labs:  Recent Results (from the past 240 hour(s))  URINE CULTURE     Status: Normal   Collection Time   07/18/11  8:34 PM      Component Value Range Status Comment   Specimen Description URINE, CATHETERIZED   Final    Special Requests Normal   Final    Culture  Setup Time [id number]   Final    Colony Count NO GROWTH   Final    Culture NO  GROWTH   Final    Report Status 07/19/2011 FINAL   Final   CULTURE, BLOOD (ROUTINE X 2)     Status: Normal (Preliminary result)   Collection Time   07/18/11  8:40 PM      Component Value Range Status Comment   Specimen Description BLOOD RIGHT ARM   Final    Special Requests BOTTLES DRAWN AEROBIC AND ANAEROBIC 5CC   Final    Culture  Setup Time [id number]   Final    Culture     Final    Value:        BLOOD CULTURE RECEIVED NO GROWTH TO DATE CULTURE WILL BE HELD FOR 5 DAYS BEFORE ISSUING A FINAL NEGATIVE REPORT   Report Status PENDING    Incomplete   CULTURE, BLOOD (ROUTINE X 2)     Status: Normal (Preliminary result)   Collection Time   07/18/11  8:45 PM      Component Value Range Status Comment   Specimen Description BLOOD RIGHT ARM   Final    Special Requests BOTTLES DRAWN AEROBIC AND ANAEROBIC 5CC   Final    Culture  Setup Time [id number]   Final    Culture     Final    Value:        BLOOD CULTURE RECEIVED NO GROWTH TO DATE CULTURE WILL BE HELD FOR 5 DAYS BEFORE ISSUING A FINAL NEGATIVE REPORT   Report Status PENDING   Incomplete     Studies/Results: No results found.  Medications: Scheduled Meds:   . albuterol  5 mg Nebulization Q4H  . aspirin  325 mg Oral Daily  . enoxaparin (LOVENOX) injection  40 mg Subcutaneous Q24H  . furosemide  60 mg Oral Daily  . lisinopril  5 mg Oral Daily  . metoprolol  50 mg Oral BID  . pantoprazole  40 mg Oral Q1200  . potassium chloride  20 mEq Oral Daily  . timolol  1 drop Right Eye BID  . DISCONTD: furosemide  40 mg Intravenous Daily  . DISCONTD: metoprolol  100 mg Oral Daily   Continuous Infusions:  PRN Meds:.acetaminophen, albuterol, alum & mag hydroxide-simeth, guaiFENesin-dextromethorphan, ondansetron  Assessment/Plan:  Principal Problem:  *Congestive heart failure 1.Acute respiratory failure: Resolved. Response to Lasix is most suggestive of acute diastolic heart failure given cardiac imaging.  Sats 97-100 on 2L. Will wean oxygen.   2. Acute diastolic (presumed) heart failure: Improved. Continue Lasxi . Continue beta blocker. Wt 101.down from 106.5 07/19/11.   3. Atrial fibrillation: Rate control. Continue beta blocker. Patient has not been on warfarin for many years. Followup as outpatient   4.HTN: controlled. Continue ACE and BB  5. Mild hyponatremia likely due to diuresing. Resolved.   6. Dispo: likely back to facility 07/23/11    LOS: 3 days   [facility] M 07/21/2011, 11:02 AM

## 2011-07-21 NOTE — Progress Notes (Signed)
Patient seen and examined. Data reviewed. Appears much better. Agree with exam assessment and plan.

## 2011-07-22 MED ORDER — FUROSEMIDE 20 MG PO TABS: 40.0000 mg | ORAL_TABLET | Freq: Every day | ORAL | Status: DC

## 2011-07-22 MED ORDER — POTASSIUM CHLORIDE CRYS ER 20 MEQ PO TBCR: 20.0000 meq | EXTENDED_RELEASE_TABLET | Freq: Every day | ORAL | Status: AC

## 2011-07-22 MED ORDER — METOPROLOL TARTRATE 50 MG PO TABS: 50.0000 mg | ORAL_TABLET | Freq: Two times a day (BID) | ORAL | Status: AC

## 2011-07-22 MED ORDER — LISINOPRIL 5 MG PO TABS: 5.0000 mg | ORAL_TABLET | Freq: Every day | ORAL | Status: DC

## 2011-07-22 NOTE — Discharge Summary (Signed)
Patient seen and examined. Agree with exam, assessment and plan. Stable for transfer today.

## 2011-07-22 NOTE — Progress Notes (Signed)
Spoke with Chanetta Marshall at [facility].  Pt can return today.  Jimmy requesting d/c summary and MAR.  Notified NP.  Faxed D/c summary and MAR.  Confirmed receipt by D'Jrecka at [facility].  Per D'Jrecka, facility ready to receive Pt.  Per NP, updated d/c summary.  Notified [employer] that an updated d/c summary to be faxed.  Asked D'Jrecka to disregard the previous d/c summary and to expect an updated d/c summary.  Faxed updated d/c summary.  Notified Pt.  Daughter and granddaughter in the room with Pt.  They were apprised of the situation, as well.  Arranged for transportation with EMS.  Pt to be d/c'd.  Providence Crosby, [facility] Clinical Social Worker [phone] Weekend Coverage

## 2011-07-22 NOTE — Progress Notes (Signed)
Per NP, Toya Smothers, Pt may be ready for d/c today.  Attempted to contact [facility] no less than 4 times this morning to determine if they can accept Pt today.  CSW to continue to follow.  Providence Crosby, [facility] Clinical Social Worker [phone] Weekend Coverage

## 2011-07-22 NOTE — Discharge Summary (Signed)
Physician Discharge Summary  Patient ID: Shelby Maynard MRN: [id number] DOB/AGE: Oct 07, 1922 78 y.o.  Admit date: 07/18/2011 Discharge date: 07/22/2011  Primary Care Physician:  No primary provider on file.   Discharge Diagnoses:    1. Acute respiratory failure: resolved 2. Acute diastolic (presumed) heart failure 3. Afib: not on coumadin for years  4. HTN   Present on Admission:  .Congestive heart failure  Medication List  As of 07/22/2011  1:23 PM   STOP taking these medications         meloxicam 15 MG tablet         TAKE these medications         acetaminophen 325 MG tablet   Commonly known as: TYLENOL   Take 650 mg by mouth every 6 (six) hours as needed. Pain      albuterol (2.5 MG/3ML) 0.083% nebulizer solution   Commonly known as: PROVENTIL   Take 2.5 mg by nebulization every 6 (six) hours as needed. wheezing      alum & mag hydroxide-simeth 400-400-40 MG/5ML suspension   Commonly known as: MAALOX PLUS   Take 30 mLs by mouth every 6 (six) hours as needed.      aspirin 325 MG tablet   Take 325 mg by mouth daily.      fluticasone 50 MCG/ACT nasal spray   Commonly known as: FLONASE   Place 2 sprays into the nose daily.      furosemide 20 MG tablet   Commonly known as: LASIX   Take 2 tablets (40 mg total) by mouth daily.      guaiFENesin-dextromethorphan 100-10 MG/5ML syrup   Commonly known as: ROBITUSSIN DM   Take 15 mLs by mouth 3 (three) times daily as needed. Cold symptoms      lisinopril 5 MG tablet   Commonly known as: PRINIVIL,ZESTRIL   Take 1 tablet (5 mg total) by mouth daily.      loratadine 10 MG tablet   Commonly known as: CLARITIN   Take 10 mg by mouth daily.      metoprolol 50 MG tablet   Commonly known as: LOPRESSOR   Take 1 tablet (50 mg total) by mouth 2 (two) times daily.      omeprazole 20 MG capsule   Commonly known as: PRILOSEC   Take 20 mg by mouth daily.      potassium chloride SA 20 MEQ tablet   Commonly known as:  K-DUR,KLOR-CON   Take 1 tablet (20 mEq total) by mouth daily.      promethazine 25 MG tablet   Commonly known as: PHENERGAN   Take 25 mg by mouth every 6 (six) hours as needed. Nausea      timolol 0.25 % ophthalmic solution   Commonly known as: BETIMOL   Place 1 drop into the right eye 2 (two) times daily.             Disposition and Follow-up: Pt. Medically stable and ready for discharge to facility. Follow up with PCP in 2 weeks. Will need BMET in 2 days  Consults:  None  Physical exam:  General: Alert, awake, oriented x3, in no acute distress. Up in chair, smiling  HEENT: No bruits, no goiter.  Heart: Irregular rhythm regular rate without murmurs, rubs, gallops.  Lungs: Normal effort. Breath sounds clear to auscultation bilaterally. No wheeze, rales  Abdomen: Soft, nontender, nondistended, positive bowel sounds.  Extremities: No clubbing cyanosis or edema with positive pedal pulses.  Neuro: Grossly intact, nonfocal.  Speech clear    Significant Diagnostic Studies:  Dg Chest Port 1 View  07/18/2011  *RADIOLOGY REPORT*  Clinical Data: Shortness of breath and weakness.  PORTABLE CHEST - 1 VIEW  Comparison: Chest radiograph performed 09/27/2005  Findings: The lungs are hypoexpanded; vascular crowding and vascular congestion are noted.  Mildly increased interstitial markings could reflect minimal interstitial edema, given the patient's symptoms.  There is no evidence of pleural effusion or pneumothorax.  The cardiomediastinal silhouette is borderline normal in caliber. No acute osseous abnormalities are seen.  IMPRESSION: Lungs hypoexpanded; vascular congestion noted.  Mildly increased interstitial markings could reflect minimal interstitial edema, given the patient's symptoms.  Original Report Authenticated By: Tonia Ghent, M.D.    Labs Reviewed  CBC - Abnormal; Notable for the following:    Hemoglobin 11.7 (*)    HCT 34.9 (*)    All other components within normal limits    DIFFERENTIAL - Abnormal; Notable for the following:    Eosinophils Relative 6 (*)    All other components within normal limits  COMPREHENSIVE METABOLIC PANEL - Abnormal; Notable for the following:    Sodium 133 (*)    Glucose, Bld 124 (*)    Total Bilirubin 0.2 (*)    GFR calc non Af Amer 53 (*)    GFR calc Af Amer 62 (*)    All other components within normal limits  PRO B NATRIURETIC PEPTIDE - Abnormal; Notable for the following:    Pro B Natriuretic peptide (BNP) 613.1 (*)    All other components within normal limits  URINALYSIS, ROUTINE W REFLEX MICROSCOPIC - Abnormal; Notable for the following:    Protein, ur 30 (*)    All other components within normal limits  BASIC METABOLIC PANEL - Abnormal; Notable for the following:    Sodium 133 (*)    Chloride 95 (*)    BUN 25 (*)    GFR calc non Af Amer 54 (*)    GFR calc Af Amer 63 (*)    All other components within normal limits  PRO B NATRIURETIC PEPTIDE - Abnormal; Notable for the following:    Pro B Natriuretic peptide (BNP) 513.3 (*)    All other components within normal limits  BASIC METABOLIC PANEL - Abnormal; Notable for the following:    GFR calc non Af Amer 49 (*)    GFR calc Af Amer 57 (*)    All other components within normal limits  CULTURE, BLOOD (ROUTINE X 2)  CULTURE, BLOOD (ROUTINE X 2)  URINE CULTURE  POCT I-STAT TROPONIN I  URINE MICROSCOPIC-ADD ON        Dg Chest Port 1 View  07/18/2011  *RADIOLOGY REPORT*  Clinical Data: Shortness of breath and weakness.  PORTABLE CHEST - 1 VIEW  Comparison: Chest radiograph performed 09/27/2005  Findings: The lungs are hypoexpanded; vascular crowding and vascular congestion are noted.  Mildly increased interstitial markings could reflect minimal interstitial edema, given the patient's symptoms.  There is no evidence of pleural effusion or pneumothorax.  The cardiomediastinal silhouette is borderline normal in caliber. No acute osseous abnormalities are seen.  IMPRESSION:  Lungs hypoexpanded; vascular congestion noted.  Mildly increased interstitial markings could reflect minimal interstitial edema, given the patient's symptoms.  Original Report Authenticated By: Tonia Ghent, M.D.       Brief H and P: For complete details please refer to admission H and P, but in brief  Shelby Maynard presented via EMS to [facility] ED 2/21 after experiencing significant SOB  with respiratory distress at the rehab facility where she is residing short term after a fall in Dec 2012. Upon arrival, pt was given neb tx and placed on Bipap with relief of her respiratory distress and Bipap discontinues.  She was also given Solumedrol IV, NTG, and ASA 325 x 1. Troponin neg in ED. EKG showed Afib with normal rate and no acute changes. CXR with increased markings and vascular congestion with ? Interstitial edema  Pt/family relate that pt has had a cough with production of clear "phlegm" for one month at the rehab facility. She has had abx, inhalers and nebs without relief of her sx. She had a CXR on 06/15/11 which showed atelectasis. Pt says she has been experiencing worsening cough at night with PND and orthopnea worsening over the past week or so. Family asked for pt to be sent here for evaluation due to the length of acute illness. Pt denies fever, chills, vomiting, chest pain, abd pain, or change in mental status. She admits to some nausea and heartburn lately and says her appetite hasn't been that great since being at the SNF for rehab. She and family deny any history of heart problems or lung problems.  Prior to Dec 2012 and the mechanical fall, she lived alone. However, she did have outside help come in a few hours per day 5 x a week for assistance. Family rotated covering the other gaps in time. Family denies any hx of dementia and says mom is "sharp as a tack". Pt has had debility 2/2 DJD with hx of several joint replacements. Says her knee "gives out" sometimes b/c she was supposed to have it replaced,  but elected not to do this. Pt doesn't drive.  Therefore, because of the pt's sx, we are asked to see, evaluate, and admit to hospital for treatment   Hospital Course:  No resolved problems to display.  Active Hospital Problems  Diagnoses Date Noted   . Congestive heart failure 07/19/2011     Class: Acute    Resolved Hospital Problems  Diagnoses Date Noted Date Resolved    Principal Problem:  *Congestive heart failure 1. Acute respiratory failure: Resolved. Pt admitted to tele. After lasix solumedrol andBipap in ED pt able to maintain Sats with [place]. At time of discharge sats range 95-97% on room airs. History in response to Lasix is most suggestive of acute diastolic heart failure given cardiac imaging. 2. Acute diastolic (presumed) heart failure: 2decho yields EF 60% with no wall motion abnormalities. Pt diuresed with favorable results. Admitting wt. 106.5. Wt at discharge 100.3. Will continue 40mg  lasix. Will also continue beta blocker. Will discontinue Meloxicam.  Recommend daily wts.  3. Atrial fibrillation:remained rate controlled during this hospitalization .According to patien/family/chart has not been on warfarin for many years. Recommend follow up with PCP to determine if coumadin candidate. 4. HTN: remained controlled during this hospitalization. Continue home meds  Time spent on Discharge: 35 minutes  Signed: Gwenyth Bender 07/22/2011, 12:22 PM

## 2011-07-22 NOTE — Discharge Instructions (Addendum)
Heart Failure Heart failure (HF) means your heart has trouble pumping blood. The blood is not circulated very well in your body because your heart is weak. HF may cause blood to back up into your lungs. This is commonly called "fluid in the lungs." HF may also cause your ankles and legs to puff up (swell). It is important to take good care of yourself when you have HF. HOME CARE Medicine  Take your medicine as told by your doctor.   Do not stop taking your medicine unless told to by your doctor.   Be sure to get your medicine refilled before it runs out.   Do not skip any doses of medicine.   Tell your doctor if you cannot afford your medicine.   Keep a list of all the medicine you take. This should include the name, how much you take, and when you take it.   Ask your doctor if you have any questions about your medicine. Do not take over-the-counter medicine unless your doctor says it is okay.  What you eat  Do not drink alcohol unless your doctor says it is okay.   Avoid food that is high in fat. Avoid foods fried in oil or made with fat.   Eat a healthy diet. A dietitian can help you with healthy food choices.   Limit how much salt you eat. Do not eat more than 1500 milligrams (mg) of salt (sodium) a day.   Do not add salt to your food.   Do not eat food made with a lot of salt. Here are some examples:   Canned vegetables.   Canned soups.   Canned drinks.   Hot dogs.   Fast food.   Pizza.   Chips.  Check your weight  Weigh yourself every morning. You should do this after you pee (urinate) and before you eat breakfast.   Wear the same amount of clothes each time you weigh yourself.   Write down your weight every day. Tell your doctor if you gain 3 lb/1.4 kg or more in 1 day or 5 lb/2.3 kg in a week.  Blood pressure monitoring  Buy a home blood pressure cuff.   Check your blood pressure as told by your doctor. Write down your blood pressure numbers on a sheet  of paper.   Bring your blood pressure numbers to your doctor visits.  Smoking  Smoking is bad for your heart.   Ask your doctor how to stop smoking.  Exercise  Talk to your doctor about exercise.   Ask how much exercise is right for you.   Exercise as much as you can. Stop if you feel tired, have problems breathing, or have chest pain.  Keep all your doctor appointments. GET HELP RIGHT AWAY IF:   You have trouble breathing.   You have a cough that does not go away.   You cannot sleep because you have trouble breathing.   You gain 3 lb/1.4 kg or more in 1 day or 5 lb/2.3 kg in a week.   You have puffy ankles or legs.   You have an enlarged (bloated) belly (abdomen).   You pass out (faint).   You have really bad chest pain or pressure. This includes pain or pressure in your:   Arms.   Jaw.   Neck.   Back.  If you have any of the above problems, call your local emergency services (911 in [place] Do not drive yourself to the hospital. MAKE   SURE YOU:   Understand these instructions.   Will watch your condition.   Will get help right away if you are not doing well or get worse.  Document Released: 02/21/2008 Document Revised: 01/24/2011 Document Reviewed: 09/14/2008 [employer] Patient Information 2012 [employer]. 

## 2011-07-25 LAB — CULTURE, BLOOD (ROUTINE X 2)
Culture  Setup Time: 201302210133
Culture: NO GROWTH

## 2011-07-28 ENCOUNTER — Encounter (HOSPITAL_COMMUNITY): Payer: Self-pay | Admitting: Emergency Medicine

## 2011-07-28 ENCOUNTER — Emergency Department (HOSPITAL_COMMUNITY): Payer: PRIVATE HEALTH INSURANCE

## 2011-07-28 ENCOUNTER — Other Ambulatory Visit: Payer: Self-pay

## 2011-07-28 ENCOUNTER — Inpatient Hospital Stay (HOSPITAL_COMMUNITY)
Admission: EM | Admit: 2011-07-28 | Discharge: 2011-07-30 | DRG: 689 | Disposition: A | Discharge status: Skilled Nursing Facility | Payer: PRIVATE HEALTH INSURANCE | Attending: Internal Medicine | Admitting: Internal Medicine

## 2011-07-28 DIAGNOSIS — B370 Candidal stomatitis: Secondary | ICD-10-CM | POA: Diagnosis present

## 2011-07-28 DIAGNOSIS — G928 Other toxic encephalopathy: Secondary | ICD-10-CM | POA: Diagnosis present

## 2011-07-28 DIAGNOSIS — A498 Other bacterial infections of unspecified site: Secondary | ICD-10-CM | POA: Diagnosis present

## 2011-07-28 DIAGNOSIS — N390 Urinary tract infection, site not specified: Principal | ICD-10-CM | POA: Diagnosis present

## 2011-07-28 DIAGNOSIS — H40059 Ocular hypertension, unspecified eye: Secondary | ICD-10-CM | POA: Diagnosis present

## 2011-07-28 DIAGNOSIS — I480 Paroxysmal atrial fibrillation: Secondary | ICD-10-CM | POA: Diagnosis present

## 2011-07-28 DIAGNOSIS — I509 Heart failure, unspecified: Secondary | ICD-10-CM | POA: Diagnosis present

## 2011-07-28 DIAGNOSIS — I5032 Chronic diastolic (congestive) heart failure: Secondary | ICD-10-CM | POA: Diagnosis present

## 2011-07-28 DIAGNOSIS — Z96649 Presence of unspecified artificial hip joint: Secondary | ICD-10-CM

## 2011-07-28 DIAGNOSIS — I10 Essential (primary) hypertension: Secondary | ICD-10-CM | POA: Diagnosis present

## 2011-07-28 DIAGNOSIS — G929 Unspecified toxic encephalopathy: Secondary | ICD-10-CM | POA: Diagnosis present

## 2011-07-28 DIAGNOSIS — G92 Toxic encephalopathy: Secondary | ICD-10-CM | POA: Diagnosis present

## 2011-07-28 DIAGNOSIS — R4182 Altered mental status, unspecified: Secondary | ICD-10-CM | POA: Diagnosis present

## 2011-07-28 DIAGNOSIS — H40009 Preglaucoma, unspecified, unspecified eye: Secondary | ICD-10-CM | POA: Diagnosis present

## 2011-07-28 DIAGNOSIS — I4891 Unspecified atrial fibrillation: Secondary | ICD-10-CM | POA: Diagnosis present

## 2011-07-28 DIAGNOSIS — E860 Dehydration: Secondary | ICD-10-CM | POA: Diagnosis present

## 2011-07-28 DIAGNOSIS — M129 Arthropathy, unspecified: Secondary | ICD-10-CM | POA: Diagnosis present

## 2011-07-28 DIAGNOSIS — E871 Hypo-osmolality and hyponatremia: Secondary | ICD-10-CM | POA: Diagnosis present

## 2011-07-28 LAB — CBC
HCT: 37 % (ref 36.0–46.0)
Hemoglobin: 12 g/dL (ref 12.0–15.0)
MCHC: 32.4 g/dL (ref 30.0–36.0)

## 2011-07-28 LAB — COMPREHENSIVE METABOLIC PANEL
BUN: 17 mg/dL (ref 6–23)
CO2: 28 mEq/L (ref 19–32)
Chloride: 97 mEq/L (ref 96–112)
Creatinine, Ser: 0.93 mg/dL (ref 0.50–1.10)
GFR calc Af Amer: 63 mL/min — ABNORMAL LOW (ref >90)
GFR calc non Af Amer: 54 mL/min — ABNORMAL LOW (ref >90)
Total Bilirubin: 0.4 mg/dL (ref 0.3–1.2)

## 2011-07-28 LAB — DIFFERENTIAL
Basophils Relative: 0 % (ref 0–1)
Lymphocytes Relative: 14 % (ref 12–46)
Monocytes Absolute: 0.7 10*3/uL (ref 0.1–1.0)
Monocytes Relative: 8 % (ref 3–12)
Neutro Abs: 5.9 10*3/uL (ref 1.7–7.7)

## 2011-07-28 LAB — URINALYSIS, ROUTINE W REFLEX MICROSCOPIC
Ketones, ur: NEGATIVE mg/dL
Leukocytes, UA: NEGATIVE
Nitrite: POSITIVE — AB
Protein, ur: NEGATIVE mg/dL

## 2011-07-28 LAB — URINE MICROSCOPIC-ADD ON

## 2011-07-28 LAB — LIPASE, BLOOD: Lipase: 41 U/L (ref 11–59)

## 2011-07-28 MED ORDER — ONDANSETRON HCL 4 MG/2ML IJ SOLN: 4.0000 mg | Freq: Four times a day (QID) | INTRAMUSCULAR | Status: DC | PRN

## 2011-07-28 MED ORDER — DEXTROSE 5 % IV SOLN
1.0000 g | Freq: Once | INTRAVENOUS | Status: AC
  Administered 2011-07-28: 1 g via INTRAVENOUS
  Filled 2011-07-28: qty 10

## 2011-07-28 MED ORDER — MORPHINE SULFATE 2 MG/ML IJ SOLN: 0.5000 mg | INTRAMUSCULAR | Status: DC | PRN

## 2011-07-28 MED ORDER — POTASSIUM CHLORIDE CRYS ER 20 MEQ PO TBCR
20.0000 meq | EXTENDED_RELEASE_TABLET | Freq: Every day | ORAL | Status: DC
  Administered 2011-07-29 – 2011-07-30 (×2): 20 meq via ORAL
  Filled 2011-07-28 (×3): qty 1

## 2011-07-28 MED ORDER — SODIUM CHLORIDE 0.9 % IV SOLN: INTRAVENOUS | Status: AC

## 2011-07-28 MED ORDER — FUROSEMIDE 20 MG PO TABS
20.0000 mg | ORAL_TABLET | Freq: Every day | ORAL | Status: DC
  Administered 2011-07-29 – 2011-07-30 (×2): 20 mg via ORAL
  Filled 2011-07-28 (×3): qty 1

## 2011-07-28 MED ORDER — ACETAMINOPHEN 325 MG PO TABS
650.0000 mg | ORAL_TABLET | Freq: Four times a day (QID) | ORAL | Status: DC | PRN
  Administered 2011-07-30: 650 mg via ORAL
  Filled 2011-07-28: qty 2

## 2011-07-28 MED ORDER — TIMOLOL HEMIHYDRATE 0.25 % OP SOLN: 1.0000 [drp] | Freq: Two times a day (BID) | OPHTHALMIC | Status: DC

## 2011-07-28 MED ORDER — FLUTICASONE PROPIONATE 50 MCG/ACT NA SUSP
2.0000 | Freq: Every day | NASAL | Status: DC
  Administered 2011-07-29 – 2011-07-30 (×2): 2 via NASAL
  Filled 2011-07-28: qty 16

## 2011-07-28 MED ORDER — TIMOLOL MALEATE 0.25 % OP SOLN
1.0000 [drp] | Freq: Two times a day (BID) | OPHTHALMIC | Status: DC
  Administered 2011-07-28 – 2011-07-30 (×4): 1 [drp] via OPHTHALMIC
  Filled 2011-07-28: qty 5

## 2011-07-28 MED ORDER — SODIUM CHLORIDE 0.9 % IV SOLN
Freq: Once | INTRAVENOUS | Status: AC
  Administered 2011-07-28: 18:00:00 via INTRAVENOUS

## 2011-07-28 MED ORDER — NYSTATIN 100000 UNIT/ML MT SUSP
5.0000 mL | Freq: Four times a day (QID) | OROMUCOSAL | Status: DC
  Administered 2011-07-28 – 2011-07-30 (×6): 500000 [IU] via ORAL
  Filled 2011-07-28: qty 60

## 2011-07-28 MED ORDER — ENOXAPARIN SODIUM 40 MG/0.4ML ~~LOC~~ SOLN
40.0000 mg | SUBCUTANEOUS | Status: DC
  Administered 2011-07-28 – 2011-07-29 (×2): 40 mg via SUBCUTANEOUS
  Filled 2011-07-28 (×4): qty 0.4

## 2011-07-28 MED ORDER — PANTOPRAZOLE SODIUM 40 MG PO TBEC
40.0000 mg | DELAYED_RELEASE_TABLET | Freq: Every day | ORAL | Status: DC
  Administered 2011-07-29 – 2011-07-30 (×2): 40 mg via ORAL
  Filled 2011-07-28 (×4): qty 1

## 2011-07-28 MED ORDER — DEXTROSE 5 % IV SOLN
1.0000 g | INTRAVENOUS | Status: DC
  Administered 2011-07-29 – 2011-07-30 (×2): 1 g via INTRAVENOUS
  Filled 2011-07-28 (×3): qty 10

## 2011-07-28 MED ORDER — METOPROLOL TARTRATE 50 MG PO TABS
50.0000 mg | ORAL_TABLET | Freq: Two times a day (BID) | ORAL | Status: DC
  Administered 2011-07-28 – 2011-07-30 (×4): 50 mg via ORAL
  Filled 2011-07-28 (×7): qty 1

## 2011-07-28 MED ORDER — ALBUTEROL SULFATE (5 MG/ML) 0.5% IN NEBU: 2.5000 mg | INHALATION_SOLUTION | Freq: Four times a day (QID) | RESPIRATORY_TRACT | Status: DC | PRN

## 2011-07-28 MED ORDER — ASPIRIN 325 MG PO TABS
325.0000 mg | ORAL_TABLET | Freq: Every day | ORAL | Status: DC
  Administered 2011-07-29 – 2011-07-30 (×2): 325 mg via ORAL
  Filled 2011-07-28 (×3): qty 1

## 2011-07-28 MED ORDER — ONDANSETRON HCL 4 MG PO TABS: 4.0000 mg | ORAL_TABLET | Freq: Four times a day (QID) | ORAL | Status: DC | PRN

## 2011-07-28 MED ORDER — LORATADINE 10 MG PO TABS
10.0000 mg | ORAL_TABLET | Freq: Every day | ORAL | Status: DC
  Administered 2011-07-29 – 2011-07-30 (×2): 10 mg via ORAL
  Filled 2011-07-28 (×3): qty 1

## 2011-07-28 NOTE — ED Provider Notes (Signed)
History     CSN: [id number]  Arrival date & time 07/28/11  1406   First MD Initiated Contact with Patient 07/28/11 1504      Chief Complaint  Patient presents with  . Altered Mental Status    (Consider location/radiation/quality/duration/timing/severity/associated sxs/prior treatment) HPI Comments: Patient presents with her family with confusion, delirium, "talking out of her head" for the past 6 days. She is discharged the hospital one week ago for respiratory distress and wants a diastolic heart failure. Family states is confused not making sense but this time she is alert and oriented x3. Patient denies any pain complaints. She denies any chest pain or shortness of breath, abdominal pain or vomiting. He is not eating very well.  No dysuria, hematuria.  No fever, chest pain, SOB, headache. Recent addition of cymbalta but symptoms started prior to that.  The history is provided by the patient and a relative.    Past Medical History  Diagnosis Date  . Hypertension   . Atrial fibrillation   . Renal disorder   . Arthritis   . CHF (congestive heart failure)     Past Surgical History  Procedure Date  . Replacement total knee   . Total hip arthroplasty     bilateral  . Eye surgery   . Abdominal hysterectomy   . Joint replacement     History reviewed. No pertinent family history.  History  Substance Use Topics  . Smoking status: Never Smoker   . Smokeless tobacco: Never Used  . Alcohol Use: No    OB History    Grav Para Term Preterm Abortions TAB SAB Ect Mult Living                  Review of Systems  Constitutional: Positive for activity change, appetite change and fatigue. Negative for fever.  HENT: Negative for congestion and rhinorrhea.   Eyes: Negative for visual disturbance.  Respiratory: Positive for cough. Negative for chest tightness and shortness of breath.   Cardiovascular: Negative for chest pain.  Gastrointestinal: Positive for nausea. Negative for  vomiting and abdominal pain.  Genitourinary: Negative for dysuria, hematuria, vaginal bleeding and vaginal discharge.  Musculoskeletal: Positive for back pain.  Skin: Negative for rash.  Neurological: Positive for weakness. Negative for headaches.  Psychiatric/Behavioral: Positive for altered mental status.    Allergies  Review of patient's allergies indicates no known allergies.  Home Medications   No current outpatient prescriptions on file.  BP 141/91  Pulse 94  Temp(Src) 97.3 F (36.3 C) (Oral)  Resp 18  Wt 221 lb 1.9 oz (100.3 kg)  SpO2 98%  Physical Exam  Constitutional: She is oriented to person, place, and time. She appears well-developed and well-nourished. No distress.  HENT:  Head: Normocephalic and atraumatic.  Mouth/Throat: Oropharynx is clear and moist. No oropharyngeal exudate.  Eyes: Pupils are equal, round, and reactive to light.       Blind in left eye  Neck: Normal range of motion. Neck supple.  Cardiovascular: Normal rate and normal heart sounds.   Pulmonary/Chest: Effort normal and breath sounds normal. No respiratory distress.       Decreased breath sounds bilaterally  Abdominal: Soft. There is no tenderness. There is no rebound and no guarding.  Musculoskeletal: Normal range of motion. She exhibits no edema and no tenderness.  Neurological: She is alert and oriented to person, place, and time. No cranial nerve deficit.  Skin: Skin is warm.    ED Course  Procedures (including  critical care time)  Labs Reviewed  COMPREHENSIVE METABOLIC PANEL - Abnormal; Notable for the following:    Sodium 134 (*)    Glucose, Bld 139 (*)    GFR calc non Af Amer 54 (*)    GFR calc Af Amer 63 (*)    All other components within normal limits  URINALYSIS, ROUTINE W REFLEX MICROSCOPIC - Abnormal; Notable for the following:    Nitrite POSITIVE (*)    All other components within normal limits  URINE MICROSCOPIC-ADD ON - Abnormal; Notable for the following:     Bacteria, UA FEW (*)    Casts HYALINE CASTS (*)    All other components within normal limits  CBC  DIFFERENTIAL  LIPASE, BLOOD  AMMONIA  LACTIC ACID, PLASMA  CARDIAC PANEL(CRET KIN+CKTOT+MB+TROPI)  URINE CULTURE  CBC  BASIC METABOLIC PANEL  RPR  TSH  VITAMIN B12   Dg Chest 2 View  07/28/2011  *RADIOLOGY REPORT*  Clinical Data: Congestive heart failure the the  CHEST - 2 VIEW  Comparison: Chest radiograph 07/18/2011  Findings: Normal cardiac silhouette with ectatic aorta.  No effusion, infiltrate, or pneumothorax.  There is severe degenerative change in the shoulders.  Low lung volumes.  IMPRESSION: No acute findings.  Low lung volumes.  Original Report Authenticated By: Genevive Bi, M.D.   Ct Head Wo Contrast  07/28/2011  *RADIOLOGY REPORT*  Clinical Data: Altered mental status.  CT HEAD WITHOUT CONTRAST  Technique:  Contiguous axial images were obtained from the base of the skull through the vertex without contrast.  Comparison: None.  Findings: Generalized atrophy.  Advanced chronic microvascular ischemic changes throughout the white matter.  No acute infarct. Negative for hemorrhage or mass lesion.  Calvarium is intact. Atrophic left eye.  IMPRESSION: Atrophy and chronic ischemic change.  No acute abnormality.  Original Report Authenticated By: Camelia Phenes, M.D.     1. Altered mental status   2. Urinary tract infection       MDM  Altered mental status and recent hospitalization for CHF exacerbation. Vital signs stable no distress mild hypoxia and hypotension.  Recent addition of cymbalta but symptoms started prior to this per family.  Work up is essentially unimpressive except for possible UTI. Family continues to state patient is not at her baseline he continues to "talk out of her head". She is talking about things that are not there and hallucinating. She is not eating well. She'll need admission.   Date: 07/28/2011  Rate: 90  Rhythm: atrial fibrillation  QRS Axis:  normal  Intervals: normal  ST/T Wave abnormalities: nonspecific ST/T changes  Conduction Disutrbances:none  Narrative Interpretation:   Old EKG Reviewed: unchanged       Glynn Octave, MD 07/29/11 [phone]

## 2011-07-28 NOTE — ED Notes (Signed)
Michelle NT transported this pt

## 2011-07-28 NOTE — ED Notes (Signed)
Patient denies pain and is resting comfortably.  

## 2011-07-28 NOTE — ED Notes (Signed)
Patient transported to CT 

## 2011-07-28 NOTE — H&P (Signed)
PCP:   Dr. Murray Hodgkins   Chief Complaint:  AMS (with hallucinations)  HPI: 78 year old female with a past medical history significant for hypertension, atrial fibrillation, newly diagnosed diastolic congestive heart failure he and recent admission to the hospital due to shortness of breath; who was transferred from SNF due to AMS, confusion and delirium. Her nursing staff at her facility and also family members patient has been talking out of her head, more confused and acting inappropriately for the last 6 days. In the ED patient himself was alert, awake and oriented x2; she denies any fever, any chest pain, shortness of breath, headache, dysuria, hematuria or any other complaints. She started given compliments to the flowers and red/blue carpets she was seen inside ED room.  Family members reported patient was started on new antidepressant drug just 2 days prior to admission but do not remember the name of it.  Of note during her last admission patient was also started on Lasix 40 mg by mouth daily and lisinopril.   Workup in the ED revealed positive nitrites in patient urine sample, mild dehydration and mild hyponatremia. [facility] has been called to admit patient for further evaluation and treatment of AMS and UTI.  Allergies:  No Known Allergies    Past Medical History  Diagnosis Date  . Hypertension   . Atrial fibrillation   . Renal disorder   . Arthritis   . CHF (congestive heart failure)     Past Surgical History  Procedure Date  . Replacement total knee   . Total hip arthroplasty     bilateral  . Eye surgery   . Abdominal hysterectomy   . Joint replacement     Prior to Admission medications   Medication Sig Start Date End Date Taking? Authorizing Provider  albuterol (PROVENTIL) (2.5 MG/3ML) 0.083% nebulizer solution Take 2.5 mg by nebulization every 6 (six) hours as needed. wheezing   Yes Historical Provider, MD  alum & mag hydroxide-simeth (MAALOX PLUS) 400-400-40  MG/5ML suspension Take 30 mLs by mouth every 6 (six) hours as needed.   Yes Historical Provider, MD  aspirin 325 MG tablet Take 325 mg by mouth daily.   Yes Historical Provider, MD  fluticasone (FLONASE) 50 MCG/ACT nasal spray Place 2 sprays into the nose daily.   Yes Historical Provider, MD  furosemide (LASIX) 20 MG tablet Take 2 tablets (40 mg total) by mouth daily. 07/22/11 07/21/12 Yes Lesle Chris Black, NP  guaiFENesin-dextromethorphan (ROBITUSSIN DM) 100-10 MG/5ML syrup Take 15 mLs by mouth 3 (three) times daily as needed. Cold symptoms   Yes Historical Provider, MD  lisinopril (PRINIVIL,ZESTRIL) 5 MG tablet Take 1 tablet (5 mg total) by mouth daily. 07/22/11 07/21/12 Yes Lesle Chris Black, NP  loratadine (CLARITIN) 10 MG tablet Take 10 mg by mouth daily.   Yes Historical Provider, MD  metoprolol (LOPRESSOR) 50 MG tablet Take 1 tablet (50 mg total) by mouth 2 (two) times daily. 07/22/11 07/21/12 Yes Lesle Chris Black, NP  omeprazole (PRILOSEC) 20 MG capsule Take 20 mg by mouth daily.   Yes Historical Provider, MD  potassium chloride SA (K-DUR,KLOR-CON) 20 MEQ tablet Take 1 tablet (20 mEq total) by mouth daily. 07/22/11 07/21/12 Yes Lesle Chris Black, NP  timolol (BETIMOL) 0.25 % ophthalmic solution Place 1 drop into the right eye 2 (two) times daily.   Yes Historical Provider, MD  acetaminophen (TYLENOL) 325 MG tablet Take 650 mg by mouth every 6 (six) hours as needed. Pain    Historical Provider, MD  promethazine (PHENERGAN) 25 MG tablet Take 25 mg by mouth every 6 (six) hours as needed. Nausea    Historical Provider, MD    Social History:  reports that she has never smoked. She has never used smokeless tobacco. She reports that she does not drink alcohol or use illicit drugs.  History reviewed. No pertinent family history.  Review of Systems:  Positive for thrush inside her mouth; general weakness and dry mucous membranes; otherwise negative except as mentioned on history of present illness.  Physical  Exam: Blood pressure 136/75, pulse 85, temperature 97.9 F (36.6 C), temperature source Oral, resp. rate 17, SpO2 98.00%. General: AAOX2, no acute distress; mild intermittent incoherent talk. HENT:  Head: Normocephalic and atraumatic.  Mouth/Throat: Oropharynx with thrush, no erythema. Dry mucous membranes Eyes: PERRL, EOMI; Blind in left eye. Neck: Normal range of motion. Neck supple. No bruits or thyromegaly Cardiovascular: RRR; S1 and S2, no murmurs.  Pulmonary/Chest: Decreased breath sounds bilaterally; no wheezing; no crackles Abdominal: Soft. There is no tenderness. There is no rebound and no guarding.  Extremities: Normal range of motion. She exhibits no edema and no tenderness.  Neurological: She is alert and oriented to person and place, No cranial nerve deficit. MS 3/5 bilaterally and simetrically. No focal deficit. No facial droop and uvula midline. Skin: Skin is warm. No rashes   Labs on Admission:  Results for orders placed during the hospital encounter of 07/28/11 (from the past 48 hour(s))  CBC     Status: Normal   Collection Time   07/28/11  3:00 PM      Component Value Range Comment   WBC 8.0  4.0 - 10.5 (K/uL)    RBC 4.41  3.87 - 5.11 (MIL/uL)    Hemoglobin 12.0  12.0 - 15.0 (g/dL)    HCT 14.7  82.9 - 56.2 (%)    MCV 83.9  78.0 - 100.0 (fL)    MCH 27.2  26.0 - 34.0 (pg)    MCHC 32.4  30.0 - 36.0 (g/dL)    RDW 13.0  86.5 - 78.4 (%)    Platelets 254  150 - 400 (K/uL)   DIFFERENTIAL     Status: Normal   Collection Time   07/28/11  3:00 PM      Component Value Range Comment   Neutrophils Relative 74  43 - 77 (%)    Neutro Abs 5.9  1.7 - 7.7 (K/uL)    Lymphocytes Relative 14  12 - 46 (%)    Lymphs Abs 1.1  0.7 - 4.0 (K/uL)    Monocytes Relative 8  3 - 12 (%)    Monocytes Absolute 0.7  0.1 - 1.0 (K/uL)    Eosinophils Relative 3  0 - 5 (%)    Eosinophils Absolute 0.2  0.0 - 0.7 (K/uL)    Basophils Relative 0  0 - 1 (%)    Basophils Absolute 0.0  0.0 - 0.1 (K/uL)    COMPREHENSIVE METABOLIC PANEL     Status: Abnormal   Collection Time   07/28/11  3:00 PM      Component Value Range Comment   Sodium 134 (*) 135 - 145 (mEq/L)    Potassium 4.6  3.5 - 5.1 (mEq/L)    Chloride 97  96 - 112 (mEq/L)    CO2 28  19 - 32 (mEq/L)    Glucose, Bld 139 (*) 70 - 99 (mg/dL)    BUN 17  6 - 23 (mg/dL)    Creatinine, Ser  0.93  0.50 - 1.10 (mg/dL)    Calcium 9.9  8.4 - 10.5 (mg/dL)    Total Protein 7.9  6.0 - 8.3 (g/dL)    Albumin 3.7  3.5 - 5.2 (g/dL)    AST 18  0 - 37 (U/L)    ALT 15  0 - 35 (U/L) NO VISIBLE HEMOLYSIS   Alkaline Phosphatase 61  39 - 117 (U/L)    Total Bilirubin 0.4  0.3 - 1.2 (mg/dL)    GFR calc non Af Amer 54 (*) >90 (mL/min)    GFR calc Af Amer 63 (*) >90 (mL/min)   LIPASE, BLOOD     Status: Normal   Collection Time   07/28/11  3:00 PM      Component Value Range Comment   Lipase 41  11 - 59 (U/L)   AMMONIA     Status: Normal   Collection Time   07/28/11  3:20 PM      Component Value Range Comment   Ammonia 28  11 - 60 (umol/L)   LACTIC ACID, PLASMA     Status: Normal   Collection Time   07/28/11  3:20 PM      Component Value Range Comment   Lactic Acid, Venous 1.6  0.5 - 2.2 (mmol/L)   CARDIAC PANEL(CRET KIN+CKTOT+MB+TROPI)     Status: Normal   Collection Time   07/28/11  3:20 PM      Component Value Range Comment   Total CK 41  7 - 177 (U/L)    CK, MB 2.2  0.3 - 4.0 (ng/mL)    Troponin I <0.30  <0.30 (ng/mL)    Relative Index RELATIVE INDEX IS INVALID  0.0 - 2.5    URINALYSIS, ROUTINE W REFLEX MICROSCOPIC     Status: Abnormal   Collection Time   07/28/11  4:46 PM      Component Value Range Comment   Color, Urine YELLOW  YELLOW     APPearance CLEAR  CLEAR     Specific Gravity, Urine 1.016  1.005 - 1.030     pH 5.5  5.0 - 8.0     Glucose, UA NEGATIVE  NEGATIVE (mg/dL)    Hgb urine dipstick NEGATIVE  NEGATIVE     Bilirubin Urine NEGATIVE  NEGATIVE     Ketones, ur NEGATIVE  NEGATIVE (mg/dL)    Protein, ur NEGATIVE  NEGATIVE (mg/dL)     Urobilinogen, UA 0.2  0.0 - 1.0 (mg/dL)    Nitrite POSITIVE (*) NEGATIVE     Leukocytes, UA NEGATIVE  NEGATIVE    URINE MICROSCOPIC-ADD ON     Status: Abnormal   Collection Time   07/28/11  4:46 PM      Component Value Range Comment   Bacteria, UA FEW (*) RARE     Casts HYALINE CASTS (*) NEGATIVE     Urine-Other MUCOUS PRESENT   AMORPHOUS URATES/PHOSPHATES    Radiological Exams on Admission: Dg Chest 2 View  07/28/2011  *RADIOLOGY REPORT*  Clinical Data: Congestive heart failure the the  CHEST - 2 VIEW  Comparison: Chest radiograph 07/18/2011  Findings: Normal cardiac silhouette with ectatic aorta.  No effusion, infiltrate, or pneumothorax.  There is severe degenerative change in the shoulders.  Low lung volumes.  IMPRESSION: No acute findings.  Low lung volumes.  Original Report Authenticated By: Genevive Bi, M.D.   Ct Head Wo Contrast  07/28/2011  *RADIOLOGY REPORT*  Clinical Data: Altered mental status.  CT HEAD WITHOUT CONTRAST  Technique:  Contiguous  axial images were obtained from the base of the skull through the vertex without contrast.  Comparison: None.  Findings: Generalized atrophy.  Advanced chronic microvascular ischemic changes throughout the white matter.  No acute infarct. Negative for hemorrhage or mass lesion.  Calvarium is intact. Atrophic left eye.  IMPRESSION: Atrophy and chronic ischemic change.  No acute abnormality.  Original Report Authenticated By: Camelia Phenes, M.D.     Assessment/Plan 1-Altered mental status: most likely 2/2 toxic metabolic encephalopathy due to UTI; other considerations are delirium 2/2 polypharmacy (especial with new medications started) and mild  dementia. MMS exam 20. Will check TSH, B12 and RPR to be thorough; will discontinue lisinopril and stop antidepressant. Provide gentle fluid resuscitation and start tx with rocephin while waiting urine culture. CT of head WNL.   2-UTI (lower urinary tract infection): As mentioned above; will start  rocephin while waiting urine cx.  3-HTN (hypertension): soft on arrival to ED; will cut Lasix in half (especially with mild dehydration and mild hyponatremia) and discontinue lisinopril. Gentle resuscitation.  3-Chronic diastolic heart failure: EF 60%; compensated. Daily weights and close I's and O's.  4-Atrial fibrillation: chronic and rate controlled. Continue metoprolol and daily ASA. No candidate for anticoagulation.  5-Dehydration: gentle IVF; resuscitation.  6-Oral candidiasis: Start nystatin.  7-Increased intraocular pressure: continue timolol  8-Generalized weakness: will ask PT to evaluate and work with her while in hospital.  9-DVT: lovenox   Time Spent on Admission: 50 minutes  Shivani Barrantes [facility] [phone]  07/28/2011, 6:32 PM

## 2011-07-28 NOTE — ED Notes (Signed)
Family at bedside. 

## 2011-07-28 NOTE — ED Notes (Signed)
Pt. unable to follow commands. c/o pain when moving lower extremities. Unable to even sit on side of bed. RN aware.

## 2011-07-28 NOTE — ED Notes (Signed)
Pt. Arrived via EMS for altered mental status.  She was discharged from hospital on Monday for heart failure.  Her confusion has been getting progressively worse since discharge.  She lives at [facility].

## 2011-07-28 NOTE — ED Notes (Signed)
Pt. Alert and oriented but having conversations out of context according to family.  Facial droop on left side.  Pt. Lethargic and speech is more difficult to understand.  Family reports symptoms started on Sat.

## 2011-07-29 LAB — BASIC METABOLIC PANEL
Chloride: 100 mEq/L (ref 96–112)
Creatinine, Ser: 0.82 mg/dL (ref 0.50–1.10)
GFR calc Af Amer: 73 mL/min — ABNORMAL LOW (ref >90)
GFR calc non Af Amer: 63 mL/min — ABNORMAL LOW (ref >90)

## 2011-07-29 LAB — RPR: RPR Ser Ql: NONREACTIVE

## 2011-07-29 LAB — CBC
MCV: 82.5 fL (ref 78.0–100.0)
Platelets: 204 10*3/uL (ref 150–400)
RDW: 15.2 % (ref 11.5–15.5)
WBC: 5.3 10*3/uL (ref 4.0–10.5)

## 2011-07-29 NOTE — Progress Notes (Signed)
Subjective: No complaints. Pleasant, talkative, oriented.  Objective: Vital signs in last 24 hours: Temp:  [97.3 F (36.3 C)-98.5 F (36.9 C)] 98.5 F (36.9 C) (03/03 0622) Pulse Rate:  [83-94] 83  (03/03 0622) Resp:  [17-21] 18  (03/03 0622) BP: (97-177)/(57-92) 141/92 mmHg (03/03 0622) SpO2:  [94 %-98 %] 94 % (03/03 0622) Weight:  [100.3 kg (221 lb 1.9 oz)-108.7 kg (239 lb 10.2 oz)] 108.7 kg (239 lb 10.2 oz) (03/03 0622) Weight change:  Last BM Date: 07/28/11  Intake/Output from previous day:       Physical Exam: General: Alert, awake,  in no acute distress. HEENT: No bruits, no goiter. Heart: Regular rate and rhythm, without murmurs, rubs, gallops. Lungs: Clear to auscultation bilaterally. Abdomen: Soft, nontender, nondistended, positive bowel sounds. Extremities: No clubbing cyanosis or edema with positive pedal pulses. Neuro: Grossly intact, nonfocal.    Lab Results: Basic Metabolic Panel:  Basename 07/29/11 0555 07/28/11 1500  NA 136 134*  K 3.5 4.6  CL 100 97  CO2 27 28  GLUCOSE 107* 139*  BUN 15 17  CREATININE 0.82 0.93  CALCIUM 9.4 9.9  MG -- --  PHOS -- --   Liver Function Tests:  Basename 07/28/11 1500  AST 18  ALT 15  ALKPHOS 61  BILITOT 0.4  PROT 7.9  ALBUMIN 3.7    Basename 07/28/11 1500  LIPASE 41  AMYLASE --    Basename 07/28/11 1520  AMMONIA 28   CBC:  Basename 07/29/11 0555 07/28/11 1500  WBC 5.3 8.0  NEUTROABS -- 5.9  HGB 10.7* 12.0  HCT 32.6* 37.0  MCV 82.5 83.9  PLT 204 254   Cardiac Enzymes:  Basename 07/28/11 1520  CKTOTAL 41  CKMB 2.2  CKMBINDEX --  TROPONINI <0.30   Urinalysis:  Basename 07/28/11 1646  COLORURINE YELLOW  LABSPEC 1.016  PHURINE 5.5  GLUCOSEU NEGATIVE  HGBUR NEGATIVE  BILIRUBINUR NEGATIVE  KETONESUR NEGATIVE  PROTEINUR NEGATIVE  UROBILINOGEN 0.2  NITRITE POSITIVE*  LEUKOCYTESUR NEGATIVE    Studies/Results: Dg Chest 2 View  07/28/2011  *RADIOLOGY REPORT*  Clinical Data:  Congestive heart failure the the  CHEST - 2 VIEW  Comparison: Chest radiograph 07/18/2011  Findings: Normal cardiac silhouette with ectatic aorta.  No effusion, infiltrate, or pneumothorax.  There is severe degenerative change in the shoulders.  Low lung volumes.  IMPRESSION: No acute findings.  Low lung volumes.  Original Report Authenticated By: Genevive Bi, M.D.   Ct Head Wo Contrast  07/28/2011  *RADIOLOGY REPORT*  Clinical Data: Altered mental status.  CT HEAD WITHOUT CONTRAST  Technique:  Contiguous axial images were obtained from the base of the skull through the vertex without contrast.  Comparison: None.  Findings: Generalized atrophy.  Advanced chronic microvascular ischemic changes throughout the white matter.  No acute infarct. Negative for hemorrhage or mass lesion.  Calvarium is intact. Atrophic left eye.  IMPRESSION: Atrophy and chronic ischemic change.  No acute abnormality.  Original Report Authenticated By: Camelia Phenes, M.D.    Medications: Scheduled Meds:   . sodium chloride   Intravenous Once  . aspirin  325 mg Oral Daily  . cefTRIAXone (ROCEPHIN)  IV  1 g Intravenous Once  . cefTRIAXone (ROCEPHIN)  IV  1 g Intravenous Q24H  . enoxaparin  40 mg Subcutaneous Q24H  . fluticasone  2 spray Each Nare Daily  . furosemide  20 mg Oral Daily  . loratadine  10 mg Oral Daily  . metoprolol  50 mg Oral BID  .  nystatin  5 mL Oral QID  . pantoprazole  40 mg Oral Q1200  . potassium chloride SA  20 mEq Oral Daily  . timolol  1 drop Right Eye BID  . DISCONTD: timolol  1 drop Right Eye BID   Continuous Infusions:   . sodium chloride 75 mL/hr at 07/28/11 2221   PRN Meds:.acetaminophen, albuterol, morphine, ondansetron (ZOFRAN) IV, ondansetron  Assessment/Plan:  Principal Problem:  *Altered mental status Active Problems:  UTI (lower urinary tract infection)  HTN (hypertension)  Chronic diastolic heart failure  Atrial fibrillation  Dehydration  Oral candidiasis  Increased  intraocular pressure   #1 Encephalopathy: likely metabolic from her UTI. Improving.  #2 UTI: Continue rocephin pending culture data.  Rest of chronic medical issues are stable.   LOS: 1 day   [facility] Pager: [phone] 07/29/2011, 8:57 AM

## 2011-07-29 NOTE — Progress Notes (Addendum)
Pt had to be reoriented frequently on shift.Expressed concern that I wasn't her RN and I was out to do harm to her.Contacted daughter, Annice Pih to speak with and calm her.

## 2011-07-30 MED ORDER — CIPROFLOXACIN HCL 250 MG PO TABS: 250.0000 mg | ORAL_TABLET | Freq: Two times a day (BID) | ORAL | Status: AC

## 2011-07-30 MED ORDER — FUROSEMIDE 20 MG PO TABS: 20.0000 mg | ORAL_TABLET | Freq: Every day | ORAL | Status: AC

## 2011-07-30 NOTE — Progress Notes (Signed)
UR complete 

## 2011-07-30 NOTE — Discharge Summary (Signed)
Physician Discharge Summary  Patient ID: Shelby Maynard MRN: [id number] DOB/AGE: 1923-03-13 78 y.o.  Admit date: 07/28/2011 Discharge date: 07/30/2011  Primary Care Physician:  No primary provider on file.   Discharge Diagnoses:    Principal Problem:  *Toxic metabolic encephalopathy Active Problems:  UTI (lower urinary tract infection)  HTN (hypertension)  Chronic diastolic heart failure  Atrial fibrillation  Dehydration  Oral candidiasis  Increased intraocular pressure    Medication List  As of 07/30/2011  3:47 PM   STOP taking these medications         lisinopril 5 MG tablet      promethazine 25 MG tablet         TAKE these medications         acetaminophen 325 MG tablet   Commonly known as: TYLENOL   Take 650 mg by mouth every 6 (six) hours as needed. Pain      albuterol (2.5 MG/3ML) 0.083% nebulizer solution   Commonly known as: PROVENTIL   Take 2.5 mg by nebulization every 6 (six) hours as needed. wheezing      alum & mag hydroxide-simeth 400-400-40 MG/5ML suspension   Commonly known as: MAALOX PLUS   Take 30 mLs by mouth every 6 (six) hours as needed.      aspirin 325 MG tablet   Take 325 mg by mouth daily.      ciprofloxacin 250 MG tablet   Commonly known as: CIPRO   Take 1 tablet (250 mg total) by mouth 2 (two) times daily.      fluticasone 50 MCG/ACT nasal spray   Commonly known as: FLONASE   Place 2 sprays into the nose daily.      furosemide 20 MG tablet   Commonly known as: LASIX   Take 1 tablet (20 mg total) by mouth daily.      guaiFENesin-dextromethorphan 100-10 MG/5ML syrup   Commonly known as: ROBITUSSIN DM   Take 15 mLs by mouth 3 (three) times daily as needed. Cold symptoms      loratadine 10 MG tablet   Commonly known as: CLARITIN   Take 10 mg by mouth daily.      metoprolol 50 MG tablet   Commonly known as: LOPRESSOR   Take 1 tablet (50 mg total) by mouth 2 (two) times daily.      omeprazole 20 MG capsule   Commonly known as:  PRILOSEC   Take 20 mg by mouth daily.      potassium chloride SA 20 MEQ tablet   Commonly known as: K-DUR,KLOR-CON   Take 1 tablet (20 mEq total) by mouth daily.      timolol 0.25 % ophthalmic solution   Commonly known as: BETIMOL   Place 1 drop into the right eye 2 (two) times daily.             Disposition and Follow-up:  Will be discharged back to SNF today in stable and improved condition. Will need to be on cipro for 7 days for a UTI.  Consults:  None    Significant Diagnostic Studies:  Dg Chest 2 View  07/28/2011  *RADIOLOGY REPORT*  Clinical Data: Congestive heart failure the the  CHEST - 2 VIEW  Comparison: Chest radiograph 07/18/2011  Findings: Normal cardiac silhouette with ectatic aorta.  No effusion, infiltrate, or pneumothorax.  There is severe degenerative change in the shoulders.  Low lung volumes.  IMPRESSION: No acute findings.  Low lung volumes.  Original Report Authenticated By: Genevive Bi, M.D.  Ct Head Wo Contrast  07/28/2011  *RADIOLOGY REPORT*  Clinical Data: Altered mental status.  CT HEAD WITHOUT CONTRAST  Technique:  Contiguous axial images were obtained from the base of the skull through the vertex without contrast.  Comparison: None.  Findings: Generalized atrophy.  Advanced chronic microvascular ischemic changes throughout the white matter.  No acute infarct. Negative for hemorrhage or mass lesion.  Calvarium is intact. Atrophic left eye.  IMPRESSION: Atrophy and chronic ischemic change.  No acute abnormality.  Original Report Authenticated By: Camelia Phenes, M.D.    Brief H and P: For complete details please refer to admission H and P, but in brief patient is an 78 year old female with a past medical history significant for hypertension, atrial fibrillation, newly diagnosed diastolic congestive heart failure and a recent admission to the hospital due to shortness of breath; who was transferred from SNF due to AMS, confusion and delirium. Her nursing  staff at her facility and also family members stated patient had been talking out of her head, more confused and acting inappropriately for the last 6 days.     Hospital Course:  Principal Problem:  *Toxic metabolic encephalopathy Active Problems:  UTI (lower urinary tract infection)  HTN (hypertension)  Chronic diastolic heart failure  Atrial fibrillation  Dehydration  Oral candidiasis  Increased intraocular pressure   #1 Encephalopathy: resolved. Secondary to UTI.  #2 E Coli UTI: Will be discharged on cipro for a 7 days course.  Rest of chronic medical issues have been stable this hospitalization.  Time spent on Discharge: Greater than 30 minutes.  SignedChaya Jan [facility] Pager: [phone] 07/30/2011, 3:47 PM

## 2011-07-30 NOTE — Progress Notes (Signed)
CSW spoke with patient's daughter, Annice Pih (cell#: [phone] home#: [phone]) re: discharge planning. Patient was admitted from [facility] where she plans to return at discharge. Patient's other daughter, Aram Beecham currently at bedside. Anticipating discharge this afternoon or tomorrow. Facility aware.   Unice Bailey, LCSWA [phone]

## 2011-07-30 NOTE — Progress Notes (Signed)
Patient is being DC back to [facility]. No changes noted since am assessment.  IV DC.  Rx and DC notes placed in SNF packet.  Daughter was notified of patient's DC time at [phone].  Patient was DC via [facility].  2 bags of belongings that daughter left in the closet were sent with the patient.  Patient denies pain at this time.

## 2011-07-30 NOTE — Evaluation (Signed)
Physical Therapy Evaluation Patient Details Name: Shelby Maynard MRN: [id number] DOB: 08-13-1922 Today's Date: 07/30/2011  Problem List:  Patient Active Problem List  Diagnoses  . Toxic metabolic encephalopathy  . UTI (lower urinary tract infection)  . HTN (hypertension)  . Chronic diastolic heart failure  . Atrial fibrillation  . Dehydration  . Oral candidiasis  . Increased intraocular pressure    Past Medical History:  Past Medical History  Diagnosis Date  . Hypertension   . Atrial fibrillation   . Renal disorder   . Arthritis   . CHF (congestive heart failure)    Past Surgical History:  Past Surgical History  Procedure Date  . Replacement total knee   . Total hip arthroplasty     bilateral  . Eye surgery   . Abdominal hysterectomy   . Joint replacement     PT Assessment/Plan/Recommendation PT Assessment Clinical Impression Statement: Pt presents with toxic metabolic encephalopathy with decreased strength and mobility.  Pt tolerated bed to chair transfer, however required increased assist and w/ c/o pain in R knee.  Pt will benefit from skilled PT in acute venue to address deficits.  PT recommends pt return to SNF to continue rehab at D/C.  PT Recommendation/Assessment: Patient will need skilled PT in the acute care venue PT Problem List: Decreased strength;Decreased range of motion;Decreased activity tolerance;Decreased balance;Decreased mobility;Decreased safety awareness;Decreased knowledge of use of DME;Decreased cognition;Pain;Obesity Barriers to Discharge: None PT Therapy Diagnosis : Difficulty walking;Generalized weakness;Acute pain PT Plan PT Frequency: Min 3X/week PT Treatment/Interventions: DME instruction;Gait training;Functional mobility training;Therapeutic activities;Therapeutic exercise;Patient/family education PT Recommendation Follow Up Recommendations: Skilled nursing facility Equipment Recommended: Defer to next venue PT Goals  Acute Rehab PT  Goals PT Goal Formulation: With patient Time For Goal Achievement: 2 weeks Pt will go Supine/Side to Sit: with min assist PT Goal: Supine/Side to Sit - Progress: Goal set today Pt will go Sit to Supine/Side: with min assist PT Goal: Sit to Supine/Side - Progress: Goal set today Pt will go Sit to Stand: with min assist PT Goal: Sit to Stand - Progress: Goal set today Pt will Ambulate: 16 - 50 feet;with rolling walker;with mod assist PT Goal: Ambulate - Progress: Goal set today  PT Evaluation Precautions/Restrictions  Precautions Precautions: Fall Prior Functioning  Home Living Lives With: Other (Comment) Receives Help From: Personal care attendant Type of Home: Skilled Nursing Facility Home Access: Level entry Home Adaptive Equipment: Walker - rolling Prior Function Level of Independence: Requires assistive device for independence;Needs assistance with ADLs;Needs assistance with homemaking Vocation: Retired Financial risk analyst Arousal/Alertness: Awake/alert Overall Cognitive Status: History of cognitive impairments History of Cognitive Impairment: Appears at baseline functioning Orientation Level: Oriented to person;Oriented to place;Oriented to time Sensation/Coordination   Extremity Assessment RLE Assessment RLE Assessment: Exceptions to [facility] RLE Strength RLE Overall Strength Comments: Grossly 3/5 per functional assessment.  LLE Assessment LLE Assessment: Exceptions to [facility] LLE Strength LLE Overall Strength Comments: Grossly 3/5 per functional assessment.  Mobility (including Balance) Bed Mobility Bed Mobility: Yes Supine to Sit: 1: +2 Total assist;Patient percentage (comment);HOB elevated (Comment degrees);With rails Supine to Sit Details (indicate cue type and reason): Pt assist 55%.  Requires assist with LE and trunk to attain sitting position, cues provided for hand placement to self assist.  Transfers Transfers: Yes Sit to Stand: 1: +2 Total assist;Patient  percentage (comment);From elevated surface;With upper extremity assist;From bed Sit to Stand Details (indicate cue type and reason): Pt assist 40%.  Requires assist for forward weight shift with cues for  hand placement on bed.  Stand to Sit: 1: +2 Total assist;Patient percentage (comment);With upper extremity assist;With armrests;To chair/3-in-1 Stand to Sit Details: Pt assist 50%.  Assist for controlled descent with cues for hand placement/technique for safety.  Stand Pivot Transfers: 1: +2 Total assist;Patient percentage (comment) Stand Pivot Transfer Details (indicate cue type and reason): Took some steps from bed to chair with +2 assist for safety  (Pt assist 50-60%).  cues for sequencing/technique with RW and for upright posture.   Ambulation/Gait Ambulation/Gait: No (Declined amb due to pt stating knee was going to "give." ) Stairs: No Wheelchair Mobility Wheelchair Mobility: No    Exercise    End of Session PT - End of Session Equipment Utilized During Treatment: Gait belt Activity Tolerance: Patient limited by fatigue;Patient limited by pain Patient left: in chair;with call bell in reach;with family/visitor present Nurse Communication: Mobility status for transfers;Mobility status for ambulation General Behavior During Session: [facility] for tasks performed Cognition: [facility] for tasks performed  Page, Meribeth Mattes 07/30/2011, 12:31 PM

## 2011-07-30 NOTE — Progress Notes (Signed)
Patient set to discharge to [facility] today. Facility & daughter at bedside aware. [facility] called for transport.   Unice Bailey, LCSWA [phone]

## 2011-08-02 LAB — URINE CULTURE

## 2012-06-23 IMAGING — CT CT HEAD W/O CM
2 series · 16 of 30 positions shown, 20 images · non-contrast
Comparison: None.

CLINICAL DATA: Altered mental status.

CT HEAD WITHOUT CONTRAST
TECHNIQUE: Contiguous axial images were obtained from the base of
the skull through the vertex without contrast.

[Series 2: head w/o · axial · non-contrast · 0.43mm/px · z∈[-153,-23]mm · 13 of 32 slices shown, 17 images]
[im 3/32  brain]
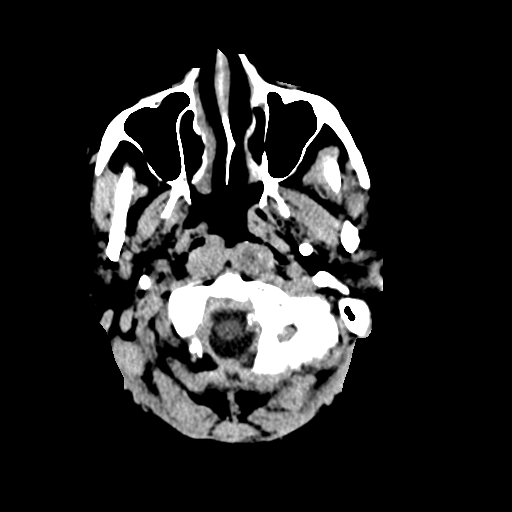
[im 3/32  bone]
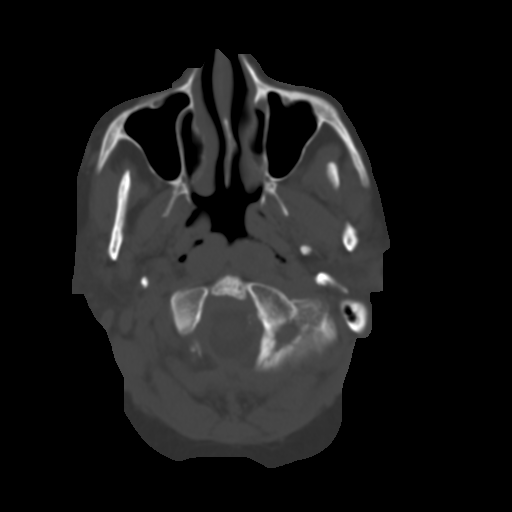
[im 5/32  brain]
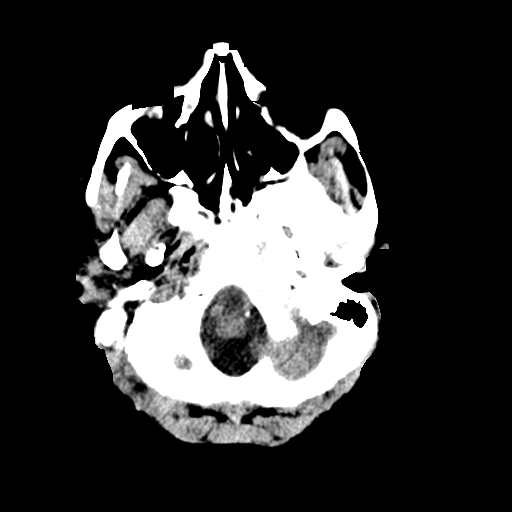
[im 7/32  brain]
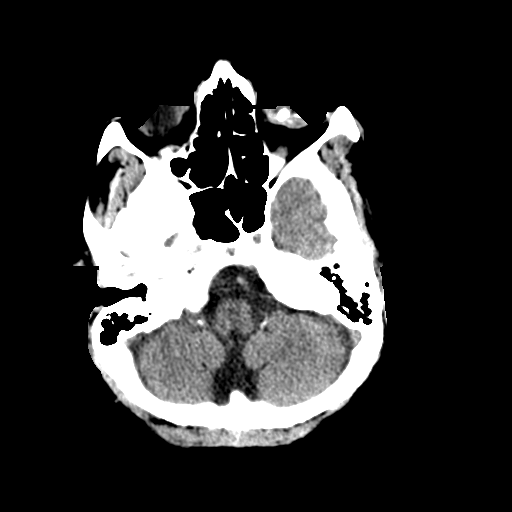
[im 9/32  brain]
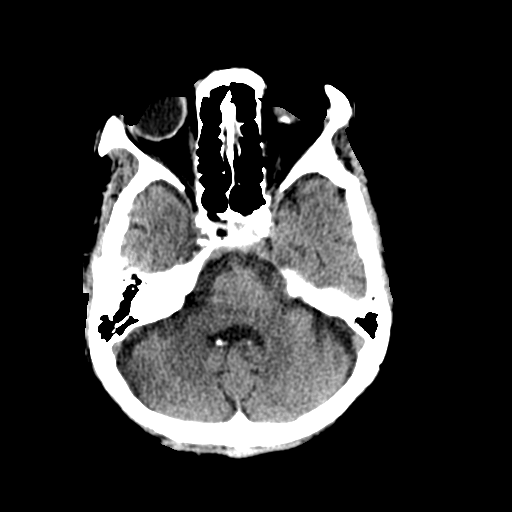
[im 12/32  brain]
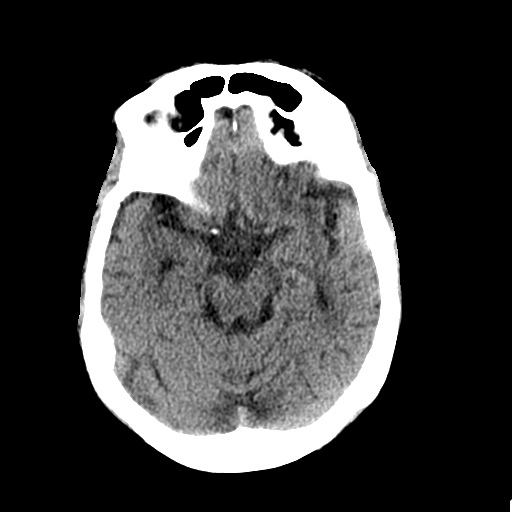
[im 12/32  bone]
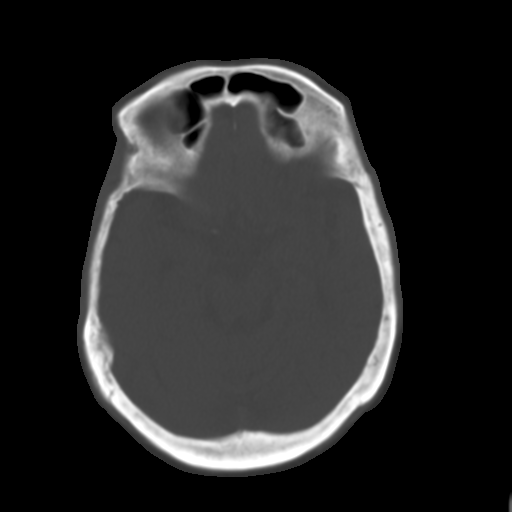
[im 14/32  brain]
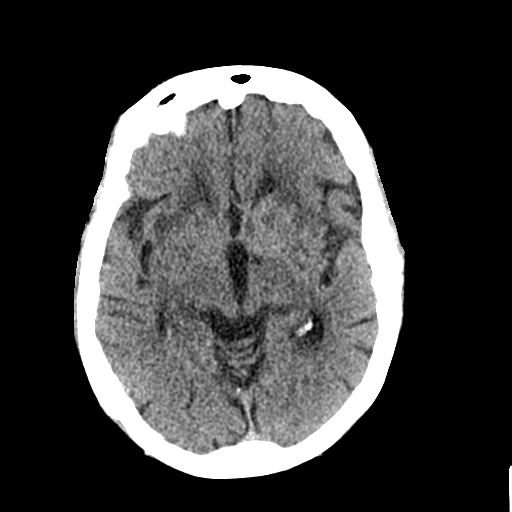
[im 16/32  brain]
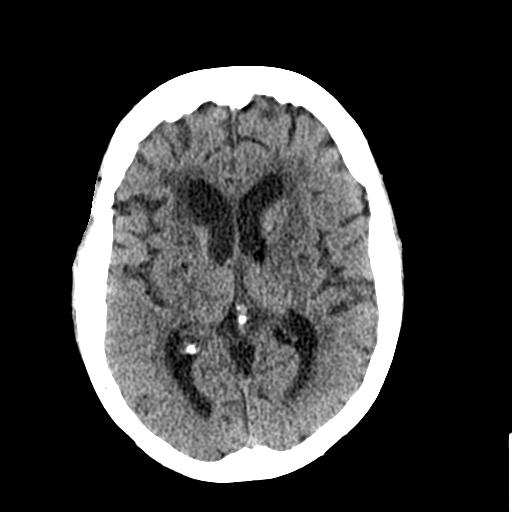
[im 18/32  brain]
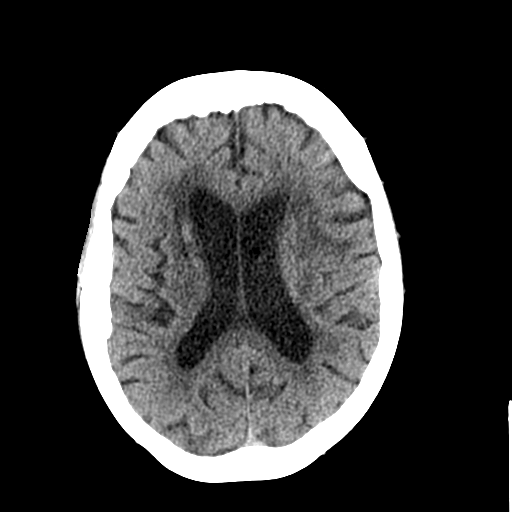
[im 20/32  brain]
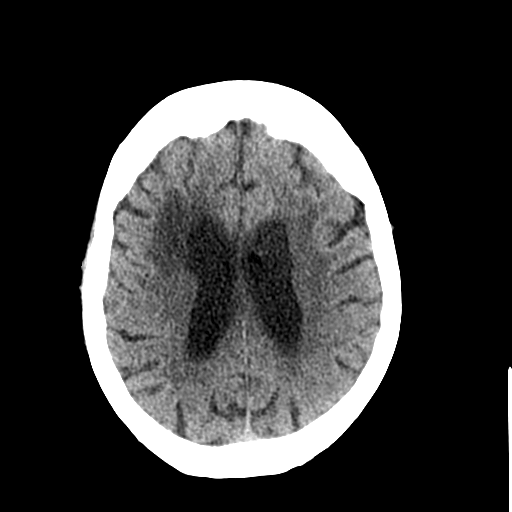
[im 20/32  bone]
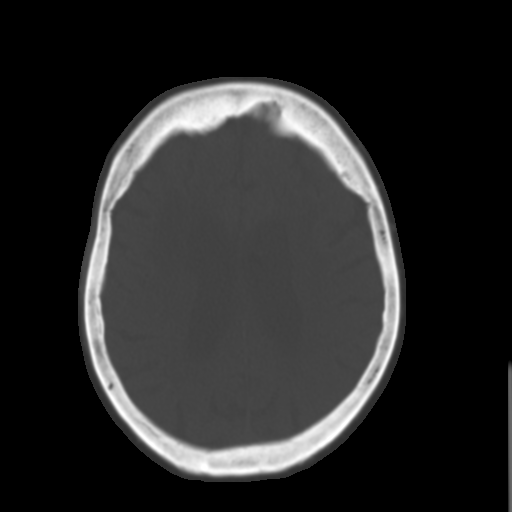
[im 23/32  brain]
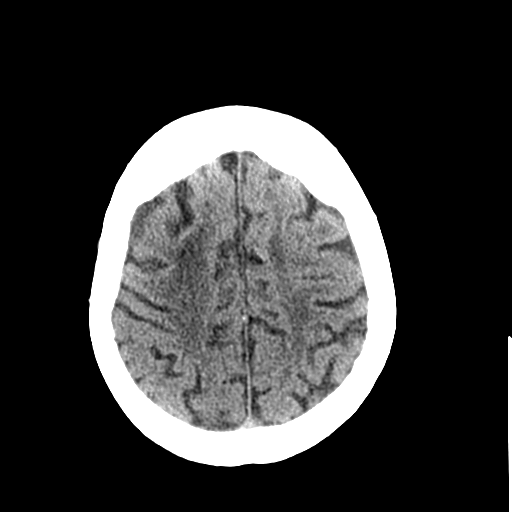
[im 25/32  brain]
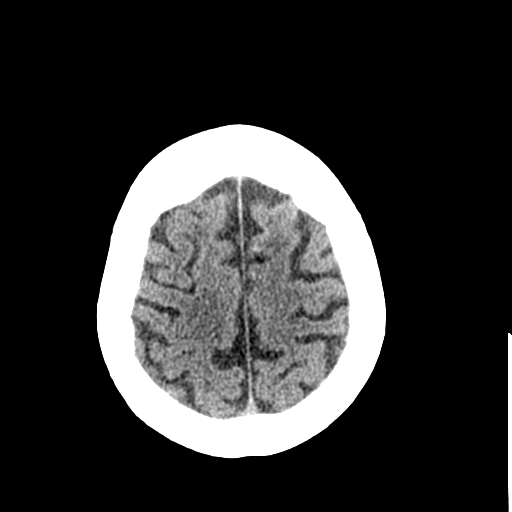
[im 27/32  brain]
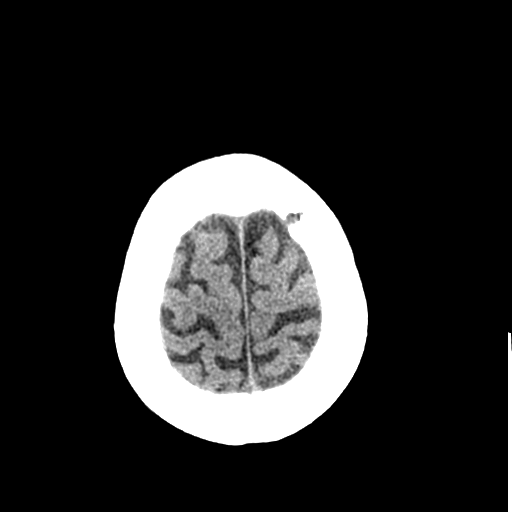
[im 29/32  brain]
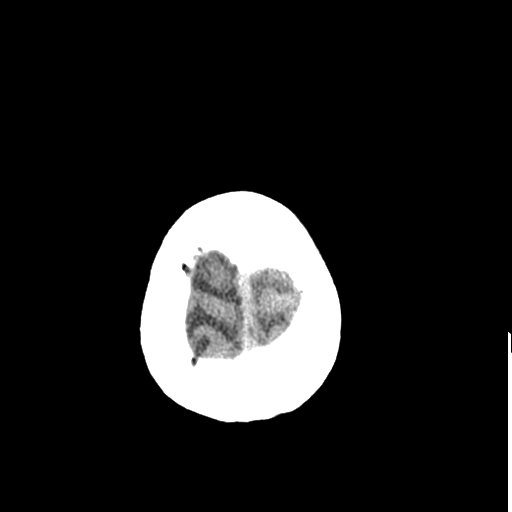
[im 29/32  bone]
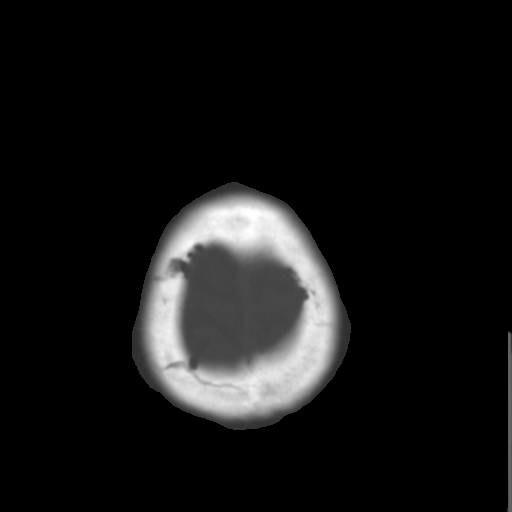

[Series 3: bone windows · axial · 0.43mm/px · z∈[-153,-108]mm · 3 of 32 slices shown]
[im 3/32  bone]
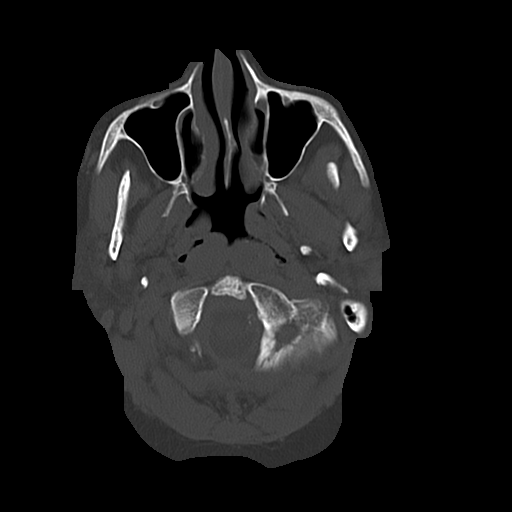
[im 7/32  bone]
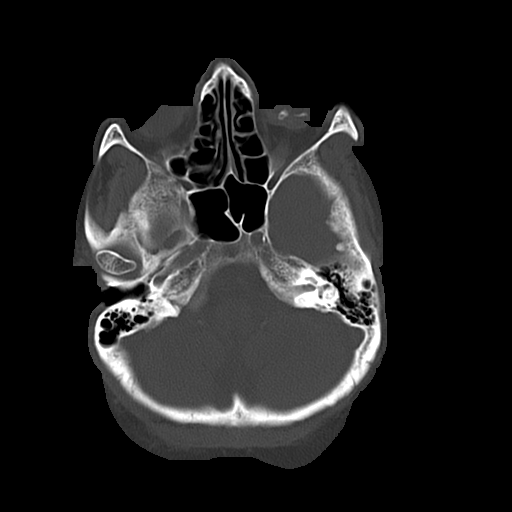
[im 12/32  bone]
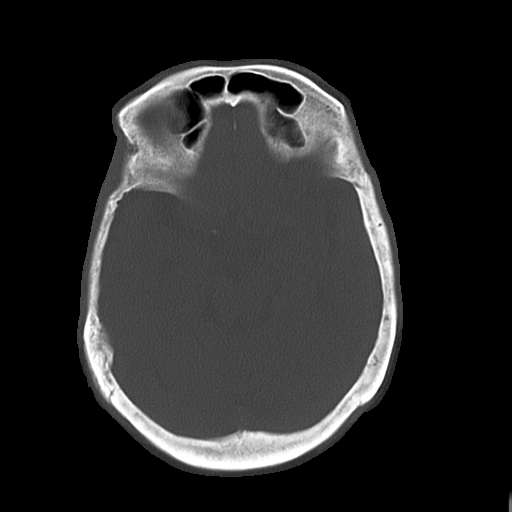

[16 of 30 positions shown; findings below may reference images not displayed]

FINDINGS: Generalized atrophy.  Advanced chronic microvascular
ischemic changes throughout the white matter.  No acute infarct.
Negative for hemorrhage or mass lesion.  Calvarium is intact.
Atrophic left eye.
IMPRESSION: Atrophy and chronic ischemic change.  No acute abnormality.

## 2012-08-14 DIAGNOSIS — K5289 Other specified noninfective gastroenteritis and colitis: Secondary | ICD-10-CM

## 2012-08-14 DIAGNOSIS — E782 Mixed hyperlipidemia: Secondary | ICD-10-CM

## 2012-08-14 DIAGNOSIS — K5900 Constipation, unspecified: Secondary | ICD-10-CM

## 2012-08-14 DIAGNOSIS — I739 Peripheral vascular disease, unspecified: Secondary | ICD-10-CM

## 2012-08-14 DIAGNOSIS — N039 Chronic nephritic syndrome with unspecified morphologic changes: Secondary | ICD-10-CM

## 2012-08-14 DIAGNOSIS — K219 Gastro-esophageal reflux disease without esophagitis: Secondary | ICD-10-CM

## 2012-08-14 DIAGNOSIS — I509 Heart failure, unspecified: Secondary | ICD-10-CM

## 2012-08-14 DIAGNOSIS — M159 Polyosteoarthritis, unspecified: Secondary | ICD-10-CM

## 2012-08-14 DIAGNOSIS — I130 Hypertensive heart and chronic kidney disease with heart failure and stage 1 through stage 4 chronic kidney disease, or unspecified chronic kidney disease: Secondary | ICD-10-CM

## 2012-09-04 DIAGNOSIS — E782 Mixed hyperlipidemia: Secondary | ICD-10-CM

## 2012-09-04 DIAGNOSIS — I130 Hypertensive heart and chronic kidney disease with heart failure and stage 1 through stage 4 chronic kidney disease, or unspecified chronic kidney disease: Secondary | ICD-10-CM

## 2012-09-04 DIAGNOSIS — N189 Chronic kidney disease, unspecified: Secondary | ICD-10-CM

## 2012-09-04 DIAGNOSIS — I509 Heart failure, unspecified: Secondary | ICD-10-CM

## 2012-09-04 DIAGNOSIS — N039 Chronic nephritic syndrome with unspecified morphologic changes: Secondary | ICD-10-CM

## 2012-10-09 DIAGNOSIS — K5900 Constipation, unspecified: Secondary | ICD-10-CM

## 2012-10-09 DIAGNOSIS — J309 Allergic rhinitis, unspecified: Secondary | ICD-10-CM

## 2012-10-09 DIAGNOSIS — I4891 Unspecified atrial fibrillation: Secondary | ICD-10-CM

## 2012-10-09 DIAGNOSIS — R1310 Dysphagia, unspecified: Secondary | ICD-10-CM

## 2012-10-09 DIAGNOSIS — E7800 Pure hypercholesterolemia, unspecified: Secondary | ICD-10-CM

## 2012-10-09 DIAGNOSIS — E876 Hypokalemia: Secondary | ICD-10-CM

## 2012-10-09 DIAGNOSIS — I509 Heart failure, unspecified: Secondary | ICD-10-CM

## 2012-10-09 DIAGNOSIS — I130 Hypertensive heart and chronic kidney disease with heart failure and stage 1 through stage 4 chronic kidney disease, or unspecified chronic kidney disease: Secondary | ICD-10-CM

## 2012-10-09 DIAGNOSIS — N039 Chronic nephritic syndrome with unspecified morphologic changes: Secondary | ICD-10-CM

## 2012-10-23 ENCOUNTER — Other Ambulatory Visit: Payer: Self-pay | Admitting: Geriatric Medicine

## 2012-10-23 MED ORDER — LORAZEPAM 0.5 MG PO TABS: 0.5000 mg | ORAL_TABLET | Freq: Three times a day (TID) | ORAL | Status: DC

## 2012-10-29 ENCOUNTER — Other Ambulatory Visit: Payer: Self-pay | Admitting: *Deleted

## 2012-10-29 MED ORDER — LORAZEPAM 0.5 MG PO TABS: ORAL_TABLET | ORAL | Status: DC

## 2012-11-11 DIAGNOSIS — F339 Major depressive disorder, recurrent, unspecified: Secondary | ICD-10-CM

## 2012-11-11 DIAGNOSIS — D649 Anemia, unspecified: Secondary | ICD-10-CM

## 2012-11-11 DIAGNOSIS — M159 Polyosteoarthritis, unspecified: Secondary | ICD-10-CM

## 2012-11-11 DIAGNOSIS — F411 Generalized anxiety disorder: Secondary | ICD-10-CM

## 2012-11-11 DIAGNOSIS — E876 Hypokalemia: Secondary | ICD-10-CM

## 2012-12-09 ENCOUNTER — Non-Acute Institutional Stay (SKILLED_NURSING_FACILITY): Payer: PRIVATE HEALTH INSURANCE | Admitting: Internal Medicine

## 2012-12-09 DIAGNOSIS — I4891 Unspecified atrial fibrillation: Secondary | ICD-10-CM

## 2012-12-09 DIAGNOSIS — I5032 Chronic diastolic (congestive) heart failure: Secondary | ICD-10-CM

## 2012-12-09 DIAGNOSIS — E7800 Pure hypercholesterolemia, unspecified: Secondary | ICD-10-CM | POA: Insufficient documentation

## 2012-12-09 DIAGNOSIS — I10 Essential (primary) hypertension: Secondary | ICD-10-CM

## 2012-12-09 NOTE — Progress Notes (Signed)
PROGRESS NOTE  DATE: 12/09/2012  FACILITY: Nursing Home Location: [facility]  LEVEL OF CARE: SNF (31)  Routine Visit  CHIEF COMPLAINT:  Manage atrial fibrillation, CHF and hyperlipidemia  HISTORY OF PRESENT ILLNESS:  REASSESSMENT OF ONGOING PROBLEM(S):  ATRIAL FIBRILLATION: the patients atrial fibrillation remains stable.  The patient denies DOE, tachycardia, orthopnea, transient neurological sx, pedal edema, palpitations, & PNDs.  No complications noted from the medications currently being used.  CHF:The patient does not relate significant weight changes, denies sob, DOE, orthopnea, PNDs, pedal edema, palpitations or chest pain.  CHF remains stable.  No complications form the medications being used.  HYPERLIPIDEMIA: No complications from the medications presently being used. Last fasting lipid panel showed : Triglycerides 239, HDL 30 otherwise fasting lipid panel and normal in 5/14.  PAST MEDICAL HISTORY : Reviewed.  No changes.  CURRENT MEDICATIONS: Reviewed per [facility]  REVIEW OF SYSTEMS:  GENERAL: no change in appetite, no fatigue, no weight changes, no fever, chills or weakness RESPIRATORY: no cough, SOB, DOE, wheezing, hemoptysis CARDIAC: no chest pain, edema or palpitations GI: no abdominal pain, diarrhea, constipation, heart burn, nausea or vomiting  PHYSICAL EXAMINATION  VS:  T 98.1       P 78      RR 18      BP 130/80     POX %     WT (Lb) 217  GENERAL: no acute distress, moderately obese body habitus EYES: conjunctivae normal, sclerae normal, normal eye lids NECK: supple, trachea midline, no neck masses, no thyroid tenderness, no thyromegaly LYMPHATICS: no LAN in the neck, no supraclavicular LAN RESPIRATORY: breathing is even & unlabored, BS CTAB CARDIAC: Heart rate irregularly irregular, no murmur,no extra heart sounds, no edema GI: abdomen soft, normal BS, no masses, no tenderness, no hepatomegaly, no splenomegaly PSYCHIATRIC: the  patient is alert & oriented to person, affect & behavior appropriate  LABS/RADIOLOGY:  4/14 CBC normal 3/14 glucose 114 otherwise BMP normal, hemoglobin A1c 6.1, TSH 0.788 1/14 magnesium 1.6, liver profile normal  ASSESSMENT/PLAN:  Hyperlipidemia-well-controlled. Atrial fibrillation-rate controlled. CHF-well compensated. Hypertension-well controlled. Hypokalemia -- well repleted. Constipation-well controlled. Allergic rhinitis-well controlled. GERD-stable. Check magnesium level and liver profile.  CPT CODE: 40981

## 2013-01-20 ENCOUNTER — Non-Acute Institutional Stay (SKILLED_NURSING_FACILITY): Payer: PRIVATE HEALTH INSURANCE | Admitting: Internal Medicine

## 2013-01-20 DIAGNOSIS — I10 Essential (primary) hypertension: Secondary | ICD-10-CM

## 2013-01-20 DIAGNOSIS — E7800 Pure hypercholesterolemia, unspecified: Secondary | ICD-10-CM

## 2013-01-20 DIAGNOSIS — I5032 Chronic diastolic (congestive) heart failure: Secondary | ICD-10-CM

## 2013-01-20 DIAGNOSIS — I4891 Unspecified atrial fibrillation: Secondary | ICD-10-CM

## 2013-01-20 NOTE — Progress Notes (Signed)
PROGRESS NOTE  DATE: 01-20-13  FACILITY: Nursing Home Location: [facility]  LEVEL OF CARE: SNF (31)  Routine Visit  CHIEF COMPLAINT:  Manage atrial fibrillation, CHF and hyperlipidemia  HISTORY OF PRESENT ILLNESS:  REASSESSMENT OF ONGOING PROBLEM(S):  ATRIAL FIBRILLATION: the patients atrial fibrillation remains stable.  The patient denies DOE, tachycardia, orthopnea, transient neurological sx, pedal edema, palpitations, & PNDs.  No complications noted from the medications currently being used.  CHF:The patient does not relate significant weight changes, denies sob, DOE, orthopnea, PNDs, pedal edema, palpitations or chest pain.  CHF remains stable.  No complications form the medications being used.  HYPERLIPIDEMIA: No complications from the medications presently being used. Last fasting lipid panel showed : Triglycerides 239, HDL 30 otherwise fasting lipid panel and normal in 5/14.  PAST MEDICAL HISTORY : Reviewed.  No changes.  CURRENT MEDICATIONS: Reviewed per [facility]  REVIEW OF SYSTEMS:  GENERAL: no change in appetite, no fatigue, no weight changes, no fever, chills or weakness RESPIRATORY: no cough, SOB, DOE, wheezing, hemoptysis CARDIAC: no chest pain, edema or palpitations GI: no abdominal pain, diarrhea, constipation, heart burn, nausea or vomiting  PHYSICAL EXAMINATION  VS:  T 98.1       P 67      RR 18      BP 130/70     POX %     WT (Lb) 219.8  GENERAL: no acute distress, moderately obese body habitus EYES: conjunctivae normal, sclerae normal, normal eye lids NECK: supple, trachea midline, no neck masses, no thyroid tenderness, no thyromegaly LYMPHATICS: no LAN in the neck, no supraclavicular LAN RESPIRATORY: breathing is even & unlabored, BS CTAB CARDIAC: Heart rate irregularly irregular, no murmur,no extra heart sounds, no edema GI: abdomen soft, normal BS, no masses, no tenderness, no hepatomegaly, no splenomegaly PSYCHIATRIC: the  patient is alert & oriented to person, affect & behavior appropriate  LABS/RADIOLOGY:  7-14 CBC and CMP normal, magnesium 1.7  4/14 CBC normal 3/14 glucose 114 otherwise BMP normal, hemoglobin A1c 6.1, TSH 0.788 1/14 magnesium 1.6, liver profile normal  ASSESSMENT/PLAN:  Hyperlipidemia-well-controlled. Atrial fibrillation-rate controlled. CHF-well compensated. Hypertension-well controlled. Hypokalemia -- well repleted. Constipation-well controlled. Allergic rhinitis-well controlled. GERD-stable  CPT CODE: 45409

## 2013-02-10 ENCOUNTER — Non-Acute Institutional Stay (SKILLED_NURSING_FACILITY): Payer: PRIVATE HEALTH INSURANCE | Admitting: Internal Medicine

## 2013-02-10 DIAGNOSIS — I4891 Unspecified atrial fibrillation: Secondary | ICD-10-CM

## 2013-02-10 DIAGNOSIS — I10 Essential (primary) hypertension: Secondary | ICD-10-CM

## 2013-02-10 DIAGNOSIS — I5032 Chronic diastolic (congestive) heart failure: Secondary | ICD-10-CM

## 2013-02-10 DIAGNOSIS — E7800 Pure hypercholesterolemia, unspecified: Secondary | ICD-10-CM

## 2013-02-11 NOTE — Progress Notes (Signed)
PROGRESS NOTE  DATE: 02-10-13  FACILITY: Nursing Home Location: [facility]  LEVEL OF CARE: SNF (31)  Routine Visit  CHIEF COMPLAINT:  Manage atrial fibrillation, CHF and hyperlipidemia  HISTORY OF PRESENT ILLNESS:  REASSESSMENT OF ONGOING PROBLEM(S):  ATRIAL FIBRILLATION: the patients atrial fibrillation remains stable.  The patient denies DOE, tachycardia, orthopnea, transient neurological sx, pedal edema, palpitations, & PNDs.  No complications noted from the medications currently being used.  CHF:The patient does not relate significant weight changes, denies sob, DOE, orthopnea, PNDs, pedal edema, palpitations or chest pain.  CHF remains stable.  No complications form the medications being used.  HYPERLIPIDEMIA: No complications from the medications presently being used. Last fasting lipid panel showed : Triglycerides 239, HDL 30 otherwise fasting lipid panel and normal in 5/14.  PAST MEDICAL HISTORY : Reviewed.  No changes.  CURRENT MEDICATIONS: Reviewed per [facility]  REVIEW OF SYSTEMS:  GENERAL: no change in appetite, no fatigue, no weight changes, no fever, chills or weakness RESPIRATORY: no cough, SOB, DOE, wheezing, hemoptysis CARDIAC: no chest pain, edema or palpitations GI: no abdominal pain, diarrhea, constipation, heart burn, nausea or vomiting  PHYSICAL EXAMINATION  VS:  T 97.9       P 68     RR 20      BP 142/92     POX %     WT (Lb) 216.2  GENERAL: no acute distress, moderately obese body habitus EYES: conjunctivae normal, sclerae normal, normal eye lids NECK: supple, trachea midline, no neck masses, no thyroid tenderness, no thyromegaly LYMPHATICS: no LAN in the neck, no supraclavicular LAN RESPIRATORY: breathing is even & unlabored, BS CTAB CARDIAC: Heart rate irregularly irregular, no murmur,no extra heart sounds, no edema GI: abdomen soft, normal BS, no masses, no tenderness, no hepatomegaly, no splenomegaly PSYCHIATRIC: the  patient is alert & oriented to person, affect & behavior appropriate  LABS/RADIOLOGY:  7-14 CBC and CMP normal, magnesium 1.7  4/14 CBC normal 3/14 glucose 114 otherwise BMP normal, hemoglobin A1c 6.1, TSH 0.788 1/14 magnesium 1.6, liver profile normal  ASSESSMENT/PLAN:  Hyperlipidemia-well-controlled. Atrial fibrillation-rate controlled. CHF-well compensated. Hypertension-well controlled. Hypokalemia -- well repleted. Constipation-well controlled. Allergic rhinitis-well controlled. GERD-stable  CPT CODE: 54098

## 2013-03-16 ENCOUNTER — Non-Acute Institutional Stay (SKILLED_NURSING_FACILITY): Payer: PRIVATE HEALTH INSURANCE | Admitting: Internal Medicine

## 2013-03-16 DIAGNOSIS — I10 Essential (primary) hypertension: Secondary | ICD-10-CM

## 2013-03-16 DIAGNOSIS — E7800 Pure hypercholesterolemia, unspecified: Secondary | ICD-10-CM

## 2013-03-16 DIAGNOSIS — I5032 Chronic diastolic (congestive) heart failure: Secondary | ICD-10-CM

## 2013-03-16 DIAGNOSIS — I4891 Unspecified atrial fibrillation: Secondary | ICD-10-CM

## 2013-03-16 NOTE — Progress Notes (Signed)
PROGRESS NOTE  DATE: 03-16-13  FACILITY: Nursing Home Location: [facility]  LEVEL OF CARE: SNF (31)  Routine Visit  CHIEF COMPLAINT:  Manage atrial fibrillation, CHF and hyperlipidemia  HISTORY OF PRESENT ILLNESS:  REASSESSMENT OF ONGOING PROBLEM(S):  ATRIAL FIBRILLATION: the patients atrial fibrillation remains stable.  The patient denies DOE, tachycardia, orthopnea, transient neurological sx, pedal edema, palpitations, & PNDs.  No complications noted from the medications currently being used.  CHF:The patient does not relate significant weight changes, denies sob, DOE, orthopnea, PNDs, pedal edema, palpitations or chest pain.  CHF remains stable.  No complications form the medications being used.  HYPERLIPIDEMIA: No complications from the medications presently being used. Last fasting lipid panel showed : Triglycerides 239, HDL 30 otherwise fasting lipid panel and normal in 5/14.  In 9-14 TG 208 ow FLP nl.  PAST MEDICAL HISTORY : Reviewed.  No changes.  CURRENT MEDICATIONS: Reviewed per [facility]  REVIEW OF SYSTEMS:  GENERAL: no change in appetite, no fatigue, no weight changes, no fever, chills or weakness RESPIRATORY: no cough, SOB, DOE, wheezing, hemoptysis CARDIAC: no chest pain, edema or palpitations GI: no abdominal pain, diarrhea, constipation, heart burn, nausea or vomiting  PHYSICAL EXAMINATION  VS:  T 97.2       P 69     RR 18      BP 121/59    POX %     WT (Lb) 217  GENERAL: no acute distress, moderately obese body habitus EYES: conjunctivae normal, sclerae normal, normal eye lids NECK: supple, trachea midline, no neck masses, no thyroid tenderness, no thyromegaly LYMPHATICS: no LAN in the neck, no supraclavicular LAN RESPIRATORY: breathing is even & unlabored, BS CTAB CARDIAC: Heart rate irregularly irregular, no murmur,no extra heart sounds, no edema GI: abdomen soft, normal BS, no masses, no tenderness, no hepatomegaly, no  splenomegaly PSYCHIATRIC: the patient is alert & oriented to person, affect & behavior appropriate  LABS/RADIOLOGY:  7-14 CBC and CMP normal, magnesium 1.7  4/14 CBC normal 3/14 glucose 114 otherwise BMP normal, hemoglobin A1c 6.1, TSH 0.788 1/14 magnesium 1.6, liver profile normal  ASSESSMENT/PLAN:  Hyperlipidemia-well-controlled.  TG improved. Atrial fibrillation-rate controlled. CHF-well compensated. Hypertension-well controlled. Hypokalemia -- well repleted. Constipation-well controlled. Allergic rhinitis-well controlled. GERD-stable  CPT CODE: 16109

## 2013-04-13 ENCOUNTER — Encounter: Payer: Self-pay | Admitting: Internal Medicine

## 2013-04-13 ENCOUNTER — Non-Acute Institutional Stay (SKILLED_NURSING_FACILITY): Payer: PRIVATE HEALTH INSURANCE | Admitting: Internal Medicine

## 2013-04-13 DIAGNOSIS — E7800 Pure hypercholesterolemia, unspecified: Secondary | ICD-10-CM

## 2013-04-13 DIAGNOSIS — I4891 Unspecified atrial fibrillation: Secondary | ICD-10-CM

## 2013-04-13 DIAGNOSIS — I10 Essential (primary) hypertension: Secondary | ICD-10-CM

## 2013-04-13 DIAGNOSIS — I5032 Chronic diastolic (congestive) heart failure: Secondary | ICD-10-CM

## 2013-04-13 NOTE — Progress Notes (Signed)
PROGRESS NOTE  DATE: 04-13-13  FACILITY: Nursing Home Location: [facility]  LEVEL OF CARE: SNF (31)  Routine Visit  CHIEF COMPLAINT:  Manage atrial fibrillation, CHF and hyperlipidemia  HISTORY OF PRESENT ILLNESS:  REASSESSMENT OF ONGOING PROBLEM(S):  ATRIAL FIBRILLATION: the patients atrial fibrillation remains stable.  The patient denies DOE, tachycardia, orthopnea, transient neurological sx, pedal edema, palpitations, & PNDs.  No complications noted from the medications currently being used.  CHF:The patient does not relate significant weight changes, denies sob, DOE, orthopnea, PNDs, pedal edema, palpitations or chest pain.  CHF remains stable.  No complications form the medications being used.  HYPERLIPIDEMIA: No complications from the medications presently being used. Last fasting lipid panel showed : Triglycerides 239, HDL 30 otherwise fasting lipid panel and normal in 5/14.  In 9-14 TG 208 ow FLP nl.  PAST MEDICAL HISTORY : Reviewed.  No changes.  CURRENT MEDICATIONS: Reviewed per [facility], [facility]  REVIEW OF SYSTEMS:  GENERAL: no change in appetite, no fatigue, no weight changes, no fever, chills or weakness RESPIRATORY: no cough, SOB, DOE, wheezing, hemoptysis CARDIAC: no chest pain, edema or palpitations GI: no abdominal pain, diarrhea, constipation, heart burn, nausea or vomiting  PHYSICAL EXAMINATION  VS:  T 97.2       P 66     RR 18      BP 121/59    POX %     WT (Lb) 217.6  GENERAL: no acute distress, moderately obese body habitus EYES: conjunctivae normal, sclerae normal, normal eye lids NECK: supple, trachea midline, no neck masses, no thyroid tenderness, no thyromegaly LYMPHATICS: no LAN in the neck, no supraclavicular LAN RESPIRATORY: breathing is even & unlabored, BS CTAB CARDIAC: Heart rate irregularly irregular, no murmur,no extra heart sounds, no edema GI: abdomen soft, normal BS, no masses, no tenderness, no hepatomegaly, no  splenomegaly PSYCHIATRIC: the patient is alert & oriented to person, affect & behavior appropriate  LABS/RADIOLOGY:  10-14 CBC and BMP normal  7-14 CBC and CMP normal, magnesium 1.7  4/14 CBC normal 3/14 glucose 114 otherwise BMP normal, hemoglobin A1c 6.1, TSH 0.788 1/14 magnesium 1.6, liver profile normal  ASSESSMENT/PLAN:  Hyperlipidemia-well-controlled.  TG improved. Atrial fibrillation-rate controlled. CHF-well compensated. Hypertension-well controlled. Hypokalemia -- well repleted. Constipation-well controlled. Allergic rhinitis-well controlled. GERD-stable  CPT CODE: 47829

## 2013-05-04 ENCOUNTER — Encounter: Payer: Self-pay | Admitting: Internal Medicine

## 2013-05-04 ENCOUNTER — Non-Acute Institutional Stay (SKILLED_NURSING_FACILITY): Payer: PRIVATE HEALTH INSURANCE | Admitting: Internal Medicine

## 2013-05-04 DIAGNOSIS — I4891 Unspecified atrial fibrillation: Secondary | ICD-10-CM

## 2013-05-04 DIAGNOSIS — E7800 Pure hypercholesterolemia, unspecified: Secondary | ICD-10-CM

## 2013-05-04 DIAGNOSIS — I5032 Chronic diastolic (congestive) heart failure: Secondary | ICD-10-CM

## 2013-05-04 DIAGNOSIS — I10 Essential (primary) hypertension: Secondary | ICD-10-CM

## 2013-05-04 NOTE — Progress Notes (Signed)
PROGRESS NOTE  DATE: 05-04-13  FACILITY: Nursing Home Location: [facility]  LEVEL OF CARE: SNF (31)  Routine Visit  CHIEF COMPLAINT:  Manage atrial fibrillation, CHF and hyperlipidemia  HISTORY OF PRESENT ILLNESS:  REASSESSMENT OF ONGOING PROBLEM(S):  ATRIAL FIBRILLATION: the patients atrial fibrillation remains stable.  The patient denies DOE, tachycardia, orthopnea, transient neurological sx, pedal edema, palpitations, & PNDs.  No complications noted from the medications currently being used.  CHF:The patient does not relate significant weight changes, denies sob, DOE, orthopnea, PNDs, pedal edema, palpitations or chest pain.  CHF remains stable.  No complications form the medications being used.  HYPERLIPIDEMIA: No complications from the medications presently being used. Last fasting lipid panel showed : Triglycerides 239, HDL 30 otherwise fasting lipid panel and normal in 5/14.  In 9-14 TG 208 ow FLP nl.  PAST MEDICAL HISTORY : Reviewed.  No changes.  CURRENT MEDICATIONS: Reviewed per [facility]  REVIEW OF SYSTEMS:  GENERAL: no change in appetite, no fatigue, no weight changes, no fever, chills or weakness RESPIRATORY: no cough, SOB, DOE, wheezing, hemoptysis CARDIAC: no chest pain, edema or palpitations GI: no abdominal pain, diarrhea, constipation, heart burn, nausea or vomiting  PHYSICAL EXAMINATION  VS:  T 97.29    P 73     RR 18      BP 133/83    POX %     WT (Lb) 224  GENERAL: no acute distress, moderately obese body habitus EYES: conjunctivae normal, sclerae normal, normal eye lids NECK: supple, trachea midline, no neck masses, no thyroid tenderness, no thyromegaly LYMPHATICS: no LAN in the neck, no supraclavicular LAN RESPIRATORY: breathing is even & unlabored, BS CTAB CARDIAC: Heart rate irregularly irregular, no murmur,no extra heart sounds, no edema GI: abdomen soft, normal BS, no masses, no tenderness, no hepatomegaly, no  splenomegaly PSYCHIATRIC: the patient is alert & oriented to person, affect & behavior appropriate  LABS/RADIOLOGY:  10-14 CBC and BMP normal  7-14 CBC and CMP normal, magnesium 1.7  4/14 CBC normal 3/14 glucose 114 otherwise BMP normal, hemoglobin A1c 6.1, TSH 0.788 1/14 magnesium 1.6, liver profile normal  ASSESSMENT/PLAN:  Hyperlipidemia-well-controlled.  TG improved. Atrial fibrillation-rate controlled. CHF-well compensated. Hypertension-well controlled. Hypokalemia -- well repleted. Constipation-well controlled. Allergic rhinitis-well controlled. GERD-stable  CPT CODE: 16109

## 2013-06-08 ENCOUNTER — Non-Acute Institutional Stay (SKILLED_NURSING_FACILITY): Payer: PRIVATE HEALTH INSURANCE | Admitting: Internal Medicine

## 2013-06-08 DIAGNOSIS — E7800 Pure hypercholesterolemia, unspecified: Secondary | ICD-10-CM

## 2013-06-08 DIAGNOSIS — I5032 Chronic diastolic (congestive) heart failure: Secondary | ICD-10-CM

## 2013-06-08 DIAGNOSIS — I10 Essential (primary) hypertension: Secondary | ICD-10-CM

## 2013-06-08 DIAGNOSIS — I4891 Unspecified atrial fibrillation: Secondary | ICD-10-CM

## 2013-06-08 NOTE — Progress Notes (Signed)
         PROGRESS NOTE  DATE: 06-08-13  FACILITY: Nursing Home Location: [facility]  LEVEL OF CARE: SNF (31)  Routine Visit  CHIEF COMPLAINT:  Manage atrial fibrillation, CHF and hyperlipidemia  HISTORY OF PRESENT ILLNESS:  REASSESSMENT OF ONGOING PROBLEM(S):  ATRIAL FIBRILLATION: the patients atrial fibrillation remains stable.  The patient denies DOE, tachycardia, orthopnea, transient neurological sx, pedal edema, palpitations, & PNDs.  No complications noted from the medications currently being used.  CHF:The patient does not relate significant weight changes, denies sob, DOE, orthopnea, PNDs, pedal edema, palpitations or chest pain.  CHF remains stable.  No complications form the medications being used.  HYPERLIPIDEMIA: No complications from the medications presently being used. Last fasting lipid panel showed : Triglycerides 239, HDL 30 otherwise fasting lipid panel and normal in 5/14.  In 9-14 TG 208 ow FLP nl.  PAST MEDICAL HISTORY : Reviewed.  No changes.  [facility]  REVIEW OF SYSTEMS:  GENERAL: no change in appetite, no fatigue, no weight changes, no fever, chills or weakness RESPIRATORY: no cough, SOB, DOE, wheezing, hemoptysis CARDIAC: no chest pain, edema or palpitations GI: no abdominal pain, diarrhea, constipation, heart burn, nausea or vomiting  PHYSICAL EXAMINATION  VS:  T 97.1   P 61     RR 22      BP 114/74    POX %     WT (Lb) 223.6  GENERAL: no acute distress, moderately obese body habitus EYES: conjunctivae normal, sclerae normal, normal eye lids NECK: supple, trachea midline, no neck masses, no thyroid tenderness, no thyromegaly LYMPHATICS: no LAN in the neck, no supraclavicular LAN RESPIRATORY: breathing is even & unlabored, BS CTAB CARDIAC: Heart rate irregularly irregular, no murmur,no extra heart sounds, no edema GI: abdomen soft, normal BS, no masses, no tenderness, no hepatomegaly, no  splenomegaly PSYCHIATRIC: the patient is alert & oriented to person, affect & behavior appropriate  LABS/RADIOLOGY:  10-14 CBC and BMP normal  7-14 CBC and CMP normal, magnesium 1.7  4/14 CBC normal 3/14 glucose 114 otherwise BMP normal, hemoglobin A1c 6.1, TSH 0.788 1/14 magnesium 1.6, liver profile normal  ASSESSMENT/PLAN:  Hyperlipidemia-well-controlled.  TG improved. Atrial fibrillation-rate controlled. CHF-well compensated. Hypertension-well controlled. Hypokalemia -- well repleted. Constipation-well controlled. Allergic rhinitis-well controlled. GERD-stable Check liver profile  CPT CODE: [id number]

## 2013-07-02 ENCOUNTER — Other Ambulatory Visit: Payer: Self-pay | Admitting: *Deleted

## 2013-07-02 MED ORDER — HYDROCODONE-ACETAMINOPHEN 5-325 MG PO TABS: 1.0000 | ORAL_TABLET | Freq: Four times a day (QID) | ORAL | Status: DC | PRN

## 2013-07-02 NOTE — Telephone Encounter (Signed)
[facility] 

## 2013-09-17 ENCOUNTER — Non-Acute Institutional Stay (SKILLED_NURSING_FACILITY): Payer: PRIVATE HEALTH INSURANCE | Admitting: Internal Medicine

## 2013-09-17 DIAGNOSIS — I4891 Unspecified atrial fibrillation: Secondary | ICD-10-CM

## 2013-09-17 DIAGNOSIS — I5032 Chronic diastolic (congestive) heart failure: Secondary | ICD-10-CM

## 2013-09-17 DIAGNOSIS — I10 Essential (primary) hypertension: Secondary | ICD-10-CM

## 2013-09-17 DIAGNOSIS — E7800 Pure hypercholesterolemia, unspecified: Secondary | ICD-10-CM

## 2013-09-18 ENCOUNTER — Encounter: Payer: Self-pay | Admitting: Internal Medicine

## 2013-09-18 ENCOUNTER — Ambulatory Visit (INDEPENDENT_AMBULATORY_CARE_PROVIDER_SITE_OTHER): Payer: Medicare Other | Admitting: Internal Medicine

## 2013-09-18 VITALS — BP 106/64 | HR 78 | Temp 98.0°F

## 2013-09-18 DIAGNOSIS — J45991 Cough variant asthma: Secondary | ICD-10-CM

## 2013-09-18 NOTE — Progress Notes (Signed)
   Subjective:    Patient ID: Sammuel CooperMarian Kluever, female    DOB: 16-Jan-1923  MRN: [id number]  HPI  [age] yobf w/c bound [facility] resident never smoker never respiratory problems referred by Dr GD for new cough to pulmonary clinic 09/18/2013   09/18/2013 1st [facility] office visit/ Doshie Maggi  Chief Complaint  Patient presents with  . Pulmonary Consult    Referred per Dr. Kerry Doryasanayaka. Pt reports had bad cold x 1 month ago and had cough and wheezing. According to [facility] records from [facility] she was txed with pred taper, duoneb txs, and clindamycin. She denies having any respiratory co's today.     Cough came on abruptly the first week in March 2015 with nl cxr and has persisted day > night, cry, s assoc apparent sob though note pt wc/ bound - better p nebs  No obvious other patterns in day to day or daytime variabilty or assoc   cp  Or chest tightness/ overt sinus or hb symptoms. No unusual exp hx or h/o childhood pna/ asthma or knowledge of premature birth.   . Also denies any obvious fluctuation of symptoms with weather or environmental changes or other aggravating or alleviating factors except as outlined above   Current Medications, Allergies, Complete Past Medical History, Past Surgical History, Family History, and Social History were reviewed in [facility] electronic medical record.          Review of Systems  Constitutional: Negative for fever, chills and unexpected weight change.  HENT: Negative for congestion, dental problem, ear pain, nosebleeds, postnasal drip, rhinorrhea, sinus pressure, sneezing, sore throat, trouble swallowing and voice change.   Eyes: Negative for visual disturbance.  Respiratory: Negative for cough, choking and shortness of breath.   Cardiovascular: Negative for chest pain and leg swelling.  Gastrointestinal: Negative for vomiting, abdominal pain and diarrhea.  Genitourinary: Negative for difficulty urinating.  Musculoskeletal: Negative for arthralgias.  Skin:  Negative for rash.  Neurological: Negative for tremors, syncope and headaches.  Hematological: Does not bruise/bleed easily.       Objective:   Physical Exam   Edentulous bf w/c bound and can't stand even for cxr with L eye opaque lens  Wt Readings from Last 3 Encounters:  09/17/13 222 lb (100.699 kg)  07/30/11 222 lb 14.2 oz (101.1 kg)  07/22/11 221 lb 1.9 oz (100.3 kg)     HEENT:  Edentulous, nl turbinates, and orophanx. Nl external ear canals without cough reflex   NECK :  without JVD/Nodes/TM/ nl carotid upstrokes bilaterally   LUNGS: no acc muscle use, clear to A and P bilaterally without cough on insp or exp maneuvers   CV:  RRR  no s3 or murmur or increase in P2, no edema   ABD:  soft and nontender with nl excursion in the supine position. No bruits or organomegaly, bowel sounds nl  MS:  warm without deformities, calf tenderness, cyanosis or clubbing  SKIN: warm and dry without lesions    NEURO:  alert, approp, no deficits     07/27/13 cxr No acute findings. Low lung volumes.     Assessment & Plan:

## 2013-09-18 NOTE — Progress Notes (Signed)
         PROGRESS NOTE  DATE: 09-17-13  FACILITY: Nursing Home Location: [facility]  LEVEL OF CARE: SNF (31)  Routine Visit  CHIEF COMPLAINT:  Manage atrial fibrillation, CHF and hyperlipidemia  HISTORY OF PRESENT ILLNESS:  REASSESSMENT OF ONGOING PROBLEM(S):  ATRIAL FIBRILLATION: the patients atrial fibrillation remains stable.  The patient denies DOE, tachycardia, orthopnea, transient neurological sx, pedal edema, palpitations, & PNDs.  No complications noted from the medications currently being used.  CHF:The patient does not relate significant weight changes, denies sob, DOE, orthopnea, PNDs, pedal edema, palpitations or chest pain.  CHF remains stable.  No complications form the medications being used.  HYPERLIPIDEMIA: No complications from the medications presently being used. Last fasting lipid panel showed : Triglycerides 239, HDL 30 otherwise fasting lipid panel and normal in 5/14.  In 9-14 TG 208 ow FLP nl, in 1-15 triglycerides 195, HDL 30 otherwise fasting lipid panel normal  PAST MEDICAL HISTORY : Reviewed.  No changes.  [facility]  REVIEW OF SYSTEMS:  GENERAL: no change in appetite, no fatigue, no weight changes, no fever, chills or weakness RESPIRATORY: no cough, SOB, DOE, wheezing, hemoptysis CARDIAC: no chest pain, edema or palpitations GI: no abdominal pain, diarrhea, constipation, heart burn, nausea or vomiting  PHYSICAL EXAMINATION  VS: See vital signs section  GENERAL: no acute distress, moderately obese body habitus EYES: conjunctivae normal, sclerae normal, normal eye lids NECK: supple, trachea midline, no neck masses, no thyroid tenderness, no thyromegaly LYMPHATICS: no LAN in the neck, no supraclavicular LAN RESPIRATORY: breathing is even & unlabored, BS CTAB CARDIAC: Heart rate irregularly irregular, no murmur,no extra heart sounds, no edema GI: abdomen soft, normal BS, no masses, no tenderness,  no hepatomegaly, no splenomegaly PSYCHIATRIC: the patient is alert & oriented to person, affect & behavior appropriate  LABS/RADIOLOGY: 3-15 glucose 110 otherwise BMP normal 4-15 WBC 11.2 otherwise CBC normal, glucose 128 otherwise BMP normal  10-14 CBC and BMP normal  7-14 CBC and CMP normal, magnesium 1.7  4/14 CBC normal 3/14 glucose 114 otherwise BMP normal, hemoglobin A1c 6.1, TSH 0.788 1/14 magnesium 1.6, liver profile normal  ASSESSMENT/PLAN:  Hyperlipidemia-well-controlled.  TG improved. Atrial fibrillation-rate controlled. CHF-well compensated. Hypertension-BP borderline Hypokalemia -- well repleted. Constipation-well controlled. Senna was decreased Allergic rhinitis-well controlled. GERD-stable  CPT CODE: [id number]  Newton PiggGayani Y. Kerry Doryasanayaka, MD [facility] [phone]

## 2013-09-18 NOTE — Patient Instructions (Addendum)
Your blood pressure pill may be causing you to cough or it may be related to your swallowing or stomach acid   rec Stop lopressor Start bisoprolol 5 mg twice daily  Add Pepcid 20 mg at bedtime and be sure the omeprazole is given   30-60 min before first meal of the day  Consider swallowing re-evaluation later as well as changing the timolol eyedrops to alternative if the cough continues   Pulmonary clinic follow up is as needed

## 2013-09-20 DIAGNOSIS — J45991 Cough variant asthma: Secondary | ICD-10-CM | POA: Insufficient documentation

## 2013-09-20 NOTE — Assessment & Plan Note (Addendum)
New onset and difficult to control but does improve immediately after neb saba.  So she has progressed from no asthma to severe asthma since her last URI, according to the hx I understand today  DDX of  difficult airways managment all start with A and  include Adherence, Ace Inhibitors, Acid Reflux, Active Sinus Disease, Alpha 1 Antitripsin deficiency, Anxiety masquerading as Airways dz,  ABPA,  allergy(esp in young), Aspiration (esp in elderly), Adverse effects of DPI,  Active smokers, plus two Bs  = Bronchiectasis and Beta blocker use..and one C= CHF  ? Acid (or non-acid) GERD > always difficult to exclude as up to 75% of pts in some series report no assoc GI/ Heartburn symptoms> rec max (24h)  acid suppression and diet restrictions/ reviewed and instructions given in writing.   ? Beta blocker effect - she is on timolol (very non-selective) and high dose lopressor (only selective at low dose)  - will try bisoprol instead for now and low threshold to change timolol to more selective agent but note she only has one eye so would only try this with opth input/ f/u  ? chf no evidence clinically   ? Allergy> very doubtful onset at age 80  See instructions for specific recommendations which were reviewed directly with the patient who was given a copy with highlighter outlining the key components.

## 2013-11-13 ENCOUNTER — Other Ambulatory Visit: Payer: Self-pay | Admitting: *Deleted

## 2013-11-13 ENCOUNTER — Non-Acute Institutional Stay (SKILLED_NURSING_FACILITY): Payer: PRIVATE HEALTH INSURANCE | Admitting: Internal Medicine

## 2013-11-13 DIAGNOSIS — E7800 Pure hypercholesterolemia, unspecified: Secondary | ICD-10-CM

## 2013-11-13 DIAGNOSIS — I482 Chronic atrial fibrillation, unspecified: Secondary | ICD-10-CM

## 2013-11-13 DIAGNOSIS — I4891 Unspecified atrial fibrillation: Secondary | ICD-10-CM

## 2013-11-13 DIAGNOSIS — J189 Pneumonia, unspecified organism: Secondary | ICD-10-CM

## 2013-11-13 DIAGNOSIS — I5032 Chronic diastolic (congestive) heart failure: Secondary | ICD-10-CM

## 2013-11-13 DIAGNOSIS — J9601 Acute respiratory failure with hypoxia: Secondary | ICD-10-CM

## 2013-11-13 DIAGNOSIS — J9600 Acute respiratory failure, unspecified whether with hypoxia or hypercapnia: Secondary | ICD-10-CM

## 2013-11-13 DIAGNOSIS — J45901 Unspecified asthma with (acute) exacerbation: Secondary | ICD-10-CM

## 2013-11-13 DIAGNOSIS — R1310 Dysphagia, unspecified: Secondary | ICD-10-CM

## 2013-11-13 DIAGNOSIS — I10 Essential (primary) hypertension: Secondary | ICD-10-CM

## 2013-11-13 MED ORDER — HYDROCODONE-ACETAMINOPHEN 5-325 MG PO TABS: ORAL_TABLET | ORAL | Status: AC

## 2013-11-13 NOTE — Telephone Encounter (Signed)
[facility] 

## 2013-11-25 ENCOUNTER — Encounter: Payer: Self-pay | Admitting: Internal Medicine

## 2013-11-25 DIAGNOSIS — J45901 Unspecified asthma with (acute) exacerbation: Secondary | ICD-10-CM | POA: Insufficient documentation

## 2013-11-25 DIAGNOSIS — R1310 Dysphagia, unspecified: Secondary | ICD-10-CM | POA: Insufficient documentation

## 2013-11-25 DIAGNOSIS — J189 Pneumonia, unspecified organism: Secondary | ICD-10-CM | POA: Insufficient documentation

## 2013-11-25 DIAGNOSIS — J9600 Acute respiratory failure, unspecified whether with hypoxia or hypercapnia: Secondary | ICD-10-CM | POA: Insufficient documentation

## 2013-11-25 NOTE — Assessment & Plan Note (Signed)
Continue zocor 5 mg daily

## 2013-11-25 NOTE — Assessment & Plan Note (Signed)
ASA as prophylaxis, pt decline coumadin and zebeta as rate control

## 2013-11-25 NOTE — Assessment & Plan Note (Signed)
Treated with broad spectrum, then levaquin with improvement

## 2013-11-25 NOTE — Assessment & Plan Note (Signed)
Presentation to hosp with SOB, fever and wheezing; pt was treated with BiPap, broad spectrum abx and prednisone . D/c with evaquin for 3 more days

## 2013-11-25 NOTE — Progress Notes (Signed)
MRN: [id number] Name: Shelby Maynard  Sex: female Age: 80 y.o. DOB: 07/04/1922  [place] #: [facility] Facility/Room: 421 Level Of Care: SNF Provider: Inocencio Homes D Emergency Contacts: Extended Emergency Contact Information Primary Emergency Contact: Everlean Patterson States of [place] Phone: [phone] Mobile Phone: [phone] Relation: Daughter Secondary Emergency Contact: Dittrich,Jackie Address: [address]           [place], [place] [address] [place] Phone: [phone] Mobile Phone: [phone] Relation: Daughter  Code Status: FULL  Allergies: Review of patient's allergies indicates no known allergies.  Chief Complaint  Patient presents with  . nursing home admission    HPI: Patient is 80 y.o. female who is admitted to SNF after being hospitalized with acute resp failure 2/2 asthma exacerbation and PNA.  Past Medical History  Diagnosis Date  . Hypertension   . Atrial fibrillation   . Renal disorder   . Arthritis   . CHF (congestive heart failure)     Past Surgical History  Procedure Laterality Date  . Replacement total knee    . Total hip arthroplasty      bilateral  . Eye surgery    . Abdominal hysterectomy    . Joint replacement        Medication List       This list is accurate as of: 11/13/13 11:59 PM.  Always use your most recent med list.               acetaminophen 325 MG tablet  Commonly known as:  TYLENOL  Take 650 mg by mouth every 6 (six) hours as needed. Pain     albuterol (2.5 MG/3ML) 0.083% nebulizer solution  Commonly known as:  PROVENTIL  Take 2.5 mg by nebulization every 6 (six) hours as needed. wheezing     ARTIFICIAL TEARS 0.1-0.3 % Soln  Apply 1 drop to eye 3 (three) times daily.     aspirin 325 MG tablet  Take 325 mg by mouth daily.     bimatoprost 0.03 % ophthalmic solution  Commonly known as:  LUMIGAN  Place 1 drop into both eyes at bedtime.     bisoprolol 5 MG tablet  Commonly  known as:  ZEBETA  Take 5 mg by mouth 2 (two) times daily.     cloNIDine 0.1 MG tablet  Commonly known as:  CATAPRES  Take 0.1 mg by mouth daily. For SBP>170     ergocalciferol 50000 UNITS capsule  Commonly known as:  VITAMIN D2  Take 50,000 Units by mouth every 30 (thirty) days.     famotidine 20 MG tablet  Commonly known as:  PEPCID  Take 20 mg by mouth at bedtime.     furosemide 20 MG tablet  Commonly known as:  LASIX  Take 1 tablet (20 mg total) by mouth daily.     guaiFENesin-dextromethorphan 100-10 MG/5ML syrup  Commonly known as:  ROBITUSSIN DM  Take 15 mLs by mouth 3 (three) times daily as needed. Cold symptoms     HYDROcodone-acetaminophen 5-325 MG per tablet  Commonly known as:  NORCO/VICODIN  Take one tablet by mouth every 6 hours as needed for pain     ipratropium-albuterol 0.5-2.5 (3) MG/3ML Soln  Commonly known as:  DUONEB  Take 3 mLs by nebulization every 6 (six) hours as needed.     levofloxacin 500 MG tablet  Commonly known as:  LEVAQUIN  Take 500 mg by mouth daily. For 3 days as of 6/18  loratadine 10 MG tablet  Commonly known as:  CLARITIN  Take 10 mg by mouth daily.     metoprolol 50 MG tablet  Commonly known as:  LOPRESSOR  Take 1 tablet (50 mg total) by mouth 2 (two) times daily.     multivitamin capsule  Take 1 capsule by mouth daily.     nitroGLYCERIN 0.4 MG SL tablet  Commonly known as:  NITROSTAT  Place 0.4 mg under the tongue every 5 (five) minutes as needed for chest pain.     omeprazole 40 MG capsule  Commonly known as:  PRILOSEC  Take 40 mg by mouth daily.     potassium chloride SA 20 MEQ tablet  Commonly known as:  K-DUR,KLOR-CON  Take 1 tablet (20 mEq total) by mouth daily.     PredniSONE 10 MG Kit  Take 1 kit by mouth daily.     saccharomyces boulardii 250 MG capsule  Commonly known as:  FLORASTOR  Take 250 mg by mouth 2 (two) times daily.     sennosides-docusate sodium 8.6-50 MG tablet  Commonly known as:   SENOKOT-S  Take 1 tablet by mouth daily as needed for constipation.     sertraline 50 MG tablet  Commonly known as:  ZOLOFT  Take 50 mg by mouth daily.     SIMBRINZA 1-0.2 % Susp  Generic drug:  Brinzolamide-Brimonidine  Apply 1 drop to eye 2 (two) times daily.     simvastatin 5 MG tablet  Commonly known as:  ZOCOR  Take 5 mg by mouth daily.        Meds ordered this encounter  Medications  . ARTIFICIAL TEARS 0.1-0.3 % SOLN    Sig: Apply 1 drop to eye 3 (three) times daily.  . bisoprolol (ZEBETA) 5 MG tablet    Sig: Take 5 mg by mouth 2 (two) times daily.  . cloNIDine (CATAPRES) 0.1 MG tablet    Sig: Take 0.1 mg by mouth daily. For SBP>170  . famotidine (PEPCID) 20 MG tablet    Sig: Take 20 mg by mouth at bedtime.  Marland Kitchen ipratropium-albuterol (DUONEB) 0.5-2.5 (3) MG/3ML SOLN    Sig: Take 3 mLs by nebulization every 6 (six) hours as needed.  . saccharomyces boulardii (FLORASTOR) 250 MG capsule    Sig: Take 250 mg by mouth 2 (two) times daily.  Marland Kitchen levofloxacin (LEVAQUIN) 500 MG tablet    Sig: Take 500 mg by mouth daily. For 3 days as of 6/18  . PredniSONE 10 MG KIT    Sig: Take 1 kit by mouth daily.     There is no immunization history on file for this patient.  History  Substance Use Topics  . Smoking status: Never Smoker   . Smokeless tobacco: Never Used  . Alcohol Use: No    Family history is noncontributory    Review of Systems  DATA OBTAINED: from patient, with dementia, nurse; pt has no c/o, nurse no concerns GENERAL:  no fevers, fatigue, appetite changes SKIN: No itching, rash EYES: No eye pain, redness, discharge EARS: No earache, tinnitus, change in hearing NOSE: No congestion, drainage or bleeding  MOUTH/THROAT: No mouth or tooth pain, RESPIRATORY: No cough, wheezing, SOB CARDIAC: No chest pain, palpitations, lower extremity edema  GI: No abdominal pain, No N/V/D or constipation, No heartburn or reflux  GU: No dysuria, frequency or urgency, or  incontinence  MUSCULOSKELETAL: No unrelieved bone/joint pain NEUROLOGIC: No headache, dizziness or focal weakness PSYCHIATRIC: No overt anxiety or sadness. Sleeps well.  No behavior issue.   Filed Vitals:   11/13/13 1740  BP: 165/93  Pulse: 70  Temp: 96.8 F (36 C)  Resp: 18    Physical Exam  GENERAL APPEARANCE: Alert, min conversant. Appropriately groomed. No acute distress.  SKIN: No diaphoresis rash, or wounds HEAD: Normocephalic, atraumatic  EYES: Conjunctiva/lids clear. Pupils round, reactive. EOMs intact.  EARS: External exam WNL, canals clear. Hearing grossly normal.  NOSE: No deformity or discharge.  MOUTH/THROAT: Lips w/o lesions.  RESPIRATORY: Breathing is even, unlabored. Lung sounds are clear   CARDIOVASCULAR: Heart RRR no murmurs, rubs or gallops. No peripheral edema.    GASTROINTESTINAL: Abdomen is soft, non-tender, not distended w/ normal bowel sounds GENITOURINARY: Bladder non tender, not distended  MUSCULOSKELETAL: No abnormal joints or musculature NEUROLOGIC: Oriented X1. Cranial nerves 2-12 grossly intact. Moves all extremities no tremor. PSYCHIATRIC: dementia, no behavioral issues  Patient Active Problem List   Diagnosis Date Noted  . Acute respiratory failure 11/25/2013  . Asthma with acute exacerbation 11/25/2013  . HCAP (healthcare-associated pneumonia) 11/25/2013  . Dysphagia, unspecified(787.20) 11/25/2013  . Cough variant asthma 09/20/2013  . Pure hypercholesterolemia 12/09/2012  . Toxic metabolic encephalopathy 41/93/7902  . UTI (lower urinary tract infection) 07/28/2011  . HTN (hypertension) 07/28/2011  . Chronic diastolic heart failure 40/97/3532  . Atrial fibrillation 07/28/2011  . Dehydration 07/28/2011  . Oral candidiasis 07/28/2011  . Increased intraocular pressure 07/28/2011    CBC    Component Value Date/Time   WBC 5.3 07/29/2011 0555   RBC 3.95 07/29/2011 0555   HGB 10.7* 07/29/2011 0555   HCT 32.6* 07/29/2011 0555   PLT 204 07/29/2011  0555   MCV 82.5 07/29/2011 0555   LYMPHSABS 1.1 07/28/2011 1500   MONOABS 0.7 07/28/2011 1500   EOSABS 0.2 07/28/2011 1500   BASOSABS 0.0 07/28/2011 1500    CMP     Component Value Date/Time   NA 136 07/29/2011 0555   K 3.5 07/29/2011 0555   CL 100 07/29/2011 0555   CO2 27 07/29/2011 0555   GLUCOSE 107* 07/29/2011 0555   BUN 15 07/29/2011 0555   CREATININE 0.82 07/29/2011 0555   CALCIUM 9.4 07/29/2011 0555   PROT 7.9 07/28/2011 1500   ALBUMIN 3.7 07/28/2011 1500   AST 18 07/28/2011 1500   ALT 15 07/28/2011 1500   ALKPHOS 61 07/28/2011 1500   BILITOT 0.4 07/28/2011 1500   GFRNONAA 63* 07/29/2011 0555   GFRAA 73* 07/29/2011 0555    Assessment and Plan  Acute respiratory failure Presentation to hosp with SOB, fever and wheezing; pt was treated with BiPap, broad spectrum abx and prednisone . D/c with evaquin for 3 more days  HCAP (healthcare-associated pneumonia) Treated with broad spectrum, then levaquin with improvement  Asthma with acute exacerbation Treated in hospital with solumedrol, O2 via bipap initially and nebs; d/c on prednisone taper , alb and duoneb nebs  HTN (hypertension) Continue Lasix and zebeta  Chronic diastolic heart failure B Blocker and lasix  Atrial fibrillation ASA as prophylaxis, pt decline coumadin and zebeta as rate control  Pure hypercholesterolemia Continue zocor 5 mg daily    Hennie Duos, MD

## 2013-11-25 NOTE — Assessment & Plan Note (Signed)
B Blocker and lasix

## 2013-11-25 NOTE — Assessment & Plan Note (Signed)
Continue Lasix and zebeta

## 2013-11-25 NOTE — Assessment & Plan Note (Signed)
Treated in hospital with solumedrol, O2 via bipap initially and nebs; d/c on prednisone taper , alb and duoneb nebs

## 2013-12-19 ENCOUNTER — Encounter: Payer: Self-pay | Admitting: Internal Medicine

## 2013-12-19 ENCOUNTER — Non-Acute Institutional Stay (SKILLED_NURSING_FACILITY): Payer: PRIVATE HEALTH INSURANCE | Admitting: Internal Medicine

## 2013-12-19 DIAGNOSIS — K219 Gastro-esophageal reflux disease without esophagitis: Secondary | ICD-10-CM | POA: Insufficient documentation

## 2013-12-19 DIAGNOSIS — F0390 Unspecified dementia without behavioral disturbance: Secondary | ICD-10-CM

## 2013-12-19 DIAGNOSIS — F01518 Vascular dementia, unspecified severity, with other behavioral disturbance: Secondary | ICD-10-CM | POA: Insufficient documentation

## 2013-12-19 DIAGNOSIS — J690 Pneumonitis due to inhalation of food and vomit: Secondary | ICD-10-CM

## 2013-12-19 DIAGNOSIS — F0151 Vascular dementia with behavioral disturbance: Secondary | ICD-10-CM | POA: Insufficient documentation

## 2013-12-19 DIAGNOSIS — J189 Pneumonia, unspecified organism: Secondary | ICD-10-CM

## 2013-12-19 DIAGNOSIS — L8990 Pressure ulcer of unspecified site, unspecified stage: Secondary | ICD-10-CM

## 2013-12-19 DIAGNOSIS — J9600 Acute respiratory failure, unspecified whether with hypoxia or hypercapnia: Secondary | ICD-10-CM

## 2013-12-19 DIAGNOSIS — L89309 Pressure ulcer of unspecified buttock, unspecified stage: Secondary | ICD-10-CM

## 2013-12-19 NOTE — Assessment & Plan Note (Signed)
Being treated for aspiration PNA, RLL, with Augmentin and levaquin both for 7 days.

## 2013-12-19 NOTE — Assessment & Plan Note (Signed)
Improving with silvidene cream

## 2013-12-19 NOTE — Assessment & Plan Note (Signed)
Continue prilosec 40 mg 

## 2013-12-19 NOTE — Assessment & Plan Note (Signed)
Treated with zoloft; no meds per RP request

## 2013-12-19 NOTE — Progress Notes (Signed)
MRN: [id number] Name: Shelby Maynard  Sex: female Age: 80 y.o. DOB: [birth year]  [place] #: [facility] Facility/Room: 421 Level Of Care: SNF Provider: Inocencio Homes D Emergency Contacts: Extended Emergency Contact Information Primary Emergency Contact: Everlean Patterson States of [place] Phone: [phone] Mobile Phone: [phone] Relation: Daughter Secondary Emergency Contact: Saksa,Jackie Address: [address]           [place], [place] [address] [place] Phone: [phone] Mobile Phone: [phone] Relation: Daughter  Code Status: FULL  Allergies: Review of patient's allergies indicates no known allergies.  Chief Complaint  Patient presents with  . Medical Management of Chronic Issues    HPI: Patient is 80 y.o. female who is being seen for routine problems.  Past Medical History  Diagnosis Date  . Hypertension   . Atrial fibrillation   . Renal disorder   . Arthritis   . CHF (congestive heart failure)   . GERD (gastroesophageal reflux disease)     Past Surgical History  Procedure Laterality Date  . Replacement total knee    . Total hip arthroplasty      bilateral  . Eye surgery    . Abdominal hysterectomy    . Joint replacement        Medication List       This list is accurate as of: 12/19/13  7:35 PM.  Always use your most recent med list.               acetaminophen 325 MG tablet  Commonly known as:  TYLENOL  Take 650 mg by mouth every 6 (six) hours as needed. Pain     albuterol (2.5 MG/3ML) 0.083% nebulizer solution  Commonly known as:  PROVENTIL  Take 2.5 mg by nebulization every 6 (six) hours as needed. wheezing     ARTIFICIAL TEARS 0.1-0.3 % Soln  Apply 1 drop to eye 3 (three) times daily.     aspirin 325 MG tablet  Take 325 mg by mouth daily.     bimatoprost 0.03 % ophthalmic solution  Commonly known as:  LUMIGAN  Place 1 drop into both eyes at bedtime.     bisoprolol 5 MG tablet  Commonly known  as:  ZEBETA  Take 5 mg by mouth 2 (two) times daily.     cloNIDine 0.1 MG tablet  Commonly known as:  CATAPRES  Take 0.1 mg by mouth daily. For SBP>170     ergocalciferol 50000 UNITS capsule  Commonly known as:  VITAMIN D2  Take 50,000 Units by mouth every 30 (thirty) days.     famotidine 20 MG tablet  Commonly known as:  PEPCID  Take 20 mg by mouth at bedtime.     furosemide 20 MG tablet  Commonly known as:  LASIX  Take 1 tablet (20 mg total) by mouth daily.     guaiFENesin-dextromethorphan 100-10 MG/5ML syrup  Commonly known as:  ROBITUSSIN DM  Take 15 mLs by mouth 3 (three) times daily as needed. Cold symptoms     HYDROcodone-acetaminophen 5-325 MG per tablet  Commonly known as:  NORCO/VICODIN  Take one tablet by mouth every 6 hours as needed for pain     ipratropium-albuterol 0.5-2.5 (3) MG/3ML Soln  Commonly known as:  DUONEB  Take 3 mLs by nebulization every 6 (six) hours as needed.     levofloxacin 500 MG tablet  Commonly known as:  LEVAQUIN  Take 500 mg by mouth daily. For 3 days as of 6/18  loratadine 10 MG tablet  Commonly known as:  CLARITIN  Take 10 mg by mouth daily.     metoprolol 50 MG tablet  Commonly known as:  LOPRESSOR  Take 1 tablet (50 mg total) by mouth 2 (two) times daily.     multivitamin capsule  Take 1 capsule by mouth daily.     nitroGLYCERIN 0.4 MG SL tablet  Commonly known as:  NITROSTAT  Place 0.4 mg under the tongue every 5 (five) minutes as needed for chest pain.     omeprazole 40 MG capsule  Commonly known as:  PRILOSEC  Take 40 mg by mouth daily.     potassium chloride SA 20 MEQ tablet  Commonly known as:  K-DUR,KLOR-CON  Take 1 tablet (20 mEq total) by mouth daily.     PredniSONE 10 MG Kit  Take 1 kit by mouth daily.     sennosides-docusate sodium 8.6-50 MG tablet  Commonly known as:  SENOKOT-S  Take 1 tablet by mouth daily as needed for constipation.     sertraline 50 MG tablet  Commonly known as:  ZOLOFT  Take  50 mg by mouth daily.     SIMBRINZA 1-0.2 % Susp  Generic drug:  Brinzolamide-Brimonidine  Apply 1 drop to eye 2 (two) times daily.     simvastatin 5 MG tablet  Commonly known as:  ZOCOR  Take 5 mg by mouth daily.        No orders of the defined types were placed in this encounter.     There is no immunization history on file for this patient.  History  Substance Use Topics  . Smoking status: Never Smoker   . Smokeless tobacco: Never Used  . Alcohol Use: No    Review of Systems  DATA OBTAINED: from patient, nurse; no c/o but does have dementia GENERAL:  no fevers, fatigue, appetite changes SKIN: No itching, rash HEENT: No complaint RESPIRATORY: No cough, wheezing, SOB CARDIAC: No chest pain, palpitations, lower extremity edema  GI: No abdominal pain, No N/V/D or constipation, No heartburn or reflux  GU: No dysuria, frequency or urgency, or incontinence  MUSCULOSKELETAL: No unrelieved bone/joint pain NEUROLOGIC: No headache, dizziness  PSYCHIATRIC: No overt anxiety or sadness   Filed Vitals:   12/19/13 1914  BP: 129/86  Pulse: 71  Temp: 96.8 F (36 C)  Resp: 20    Physical Exam  GENERAL APPEARANCE: Alert, modconversant. Appropriately groomed. No acute distress  SKIN: No diaphoresis rash;buttocks wound not examined HEENT: Unremarkable RESPIRATORY: Breathing is even, unlabored. Lung sounds are clear   CARDIOVASCULAR: Heart RRR no murmurs, rubs or gallops. No peripheral edema  GASTROINTESTINAL: Abdomen is soft, non-tender, not distended w/ normal bowel sounds.  GENITOURINARY: Bladder non tender, not distended  MUSCULOSKELETAL: No abnormal joints or musculature NEUROLOGIC: Cranial nerves 2-12 grossly intact. Moves UE extremities no tremor. PSYCHIATRIC: dementia, no behavioral issues  Patient Active Problem List   Diagnosis Date Noted  . Aspiration pneumonia 12/19/2013  . Decubitus ulcer of buttock 12/19/2013  . Dementia without behavioral disturbance  12/19/2013  . GERD (gastroesophageal reflux disease)   . Acute respiratory failure 11/25/2013  . Asthma with acute exacerbation 11/25/2013  . HCAP (healthcare-associated pneumonia) 11/25/2013  . Dysphagia, unspecified(787.20) 11/25/2013  . Cough variant asthma 09/20/2013  . Pure hypercholesterolemia 12/09/2012  . Toxic metabolic encephalopathy 56/38/9373  . UTI (lower urinary tract infection) 07/28/2011  . HTN (hypertension) 07/28/2011  . Chronic diastolic heart failure 42/87/6811  . Atrial fibrillation 07/28/2011  . Dehydration  07/28/2011  . Oral candidiasis 07/28/2011  . Increased intraocular pressure 07/28/2011    CBC    Component Value Date/Time   WBC 5.3 07/29/2011 0555   RBC 3.95 07/29/2011 0555   HGB 10.7* 07/29/2011 0555   HCT 32.6* 07/29/2011 0555   PLT 204 07/29/2011 0555   MCV 82.5 07/29/2011 0555   LYMPHSABS 1.1 07/28/2011 1500   MONOABS 0.7 07/28/2011 1500   EOSABS 0.2 07/28/2011 1500   BASOSABS 0.0 07/28/2011 1500      Assessment and Plan  HCAP (healthcare-associated pneumonia) Being treated for aspiration PNA, RLL, with Augmentin and levaquin both for 7 days.  Aspiration pneumonia Being treated for aspiration PNA, RLL, treated with rocephin IM for 4 days then with Augmentin and levaquin both for 7 days.; ST has seen her and pt is to have supervised meals  Acute respiratory failure Pt is non compliant with her C PAP which could lead to hypercarbic respiratory failure again  Decubitus ulcer of buttock Improving with silvidene cream  Dementia without behavioral disturbance Treated with zoloft; no meds per RP request  GERD (gastroesophageal reflux disease) Continue prilosec 40 mg    Hennie Duos, MD

## 2013-12-19 NOTE — Assessment & Plan Note (Addendum)
Being treated for aspiration PNA, RLL, treated with rocephin IM for 4 days then with Augmentin and levaquin both for 7 days.; ST has seen her and pt is to have supervised meals

## 2013-12-19 NOTE — Assessment & Plan Note (Signed)
Pt is non compliant with her C PAP which could lead to hypercarbic respiratory failure again

## 2014-02-24 ENCOUNTER — Non-Acute Institutional Stay (SKILLED_NURSING_FACILITY): Payer: PRIVATE HEALTH INSURANCE | Admitting: Internal Medicine

## 2014-02-24 DIAGNOSIS — I5032 Chronic diastolic (congestive) heart failure: Secondary | ICD-10-CM

## 2014-02-24 DIAGNOSIS — I482 Chronic atrial fibrillation, unspecified: Secondary | ICD-10-CM

## 2014-02-24 DIAGNOSIS — J45991 Cough variant asthma: Secondary | ICD-10-CM

## 2014-02-24 DIAGNOSIS — I4891 Unspecified atrial fibrillation: Secondary | ICD-10-CM

## 2014-02-24 DIAGNOSIS — I10 Essential (primary) hypertension: Secondary | ICD-10-CM

## 2014-02-24 DIAGNOSIS — N181 Chronic kidney disease, stage 1: Secondary | ICD-10-CM

## 2014-02-24 DIAGNOSIS — E7800 Pure hypercholesterolemia, unspecified: Secondary | ICD-10-CM

## 2014-02-24 DIAGNOSIS — R1312 Dysphagia, oropharyngeal phase: Secondary | ICD-10-CM

## 2014-02-24 NOTE — Progress Notes (Signed)
MRN: [id number] Name: Shelby Maynard  Sex: female Age: 80 y.o. DOB: May 15, 1923  [place] #: [facility] Facility/Room: 121D Level Of Care: SNF Provider: Inocencio Homes D Emergency Contacts: Extended Emergency Contact Information Primary Emergency Contact: Everlean Patterson States of [place] Phone: [phone] Mobile Phone: [phone] Relation: Daughter Secondary Emergency Contact: Amesquita,Jackie Address: [address]           [place], [place] [address] [place] Phone: [phone] Mobile Phone: [phone] Relation: Daughter  Code Status: FULL  Allergies: Review of patient's allergies indicates no known allergies.  Chief Complaint  Patient presents with  . Medical Management of Chronic Issues    HPI: Patient is 80 y.o. female who is being seen for routine issues.   Past Medical History  Diagnosis Date  . Hypertension   . Atrial fibrillation   . Renal disorder   . Arthritis   . CHF (congestive heart failure)   . GERD (gastroesophageal reflux disease)     Past Surgical History  Procedure Laterality Date  . Replacement total knee    . Total hip arthroplasty      bilateral  . Eye surgery    . Abdominal hysterectomy    . Joint replacement        Medication List       This list is accurate as of: 02/24/14 11:59 PM.  Always use your most recent med list.               acetaminophen 325 MG tablet  Commonly known as:  TYLENOL  Take 650 mg by mouth every 6 (six) hours as needed. Pain     albuterol (2.5 MG/3ML) 0.083% nebulizer solution  Commonly known as:  PROVENTIL  Take 2.5 mg by nebulization every 6 (six) hours as needed. wheezing     ARTIFICIAL TEARS 0.1-0.3 % Soln  Apply 1 drop to eye 3 (three) times daily.     aspirin 325 MG tablet  Take 325 mg by mouth daily.     bimatoprost 0.03 % ophthalmic solution  Commonly known as:  LUMIGAN  Place 1 drop into both eyes at bedtime.     bisoprolol 5 MG tablet  Commonly known  as:  ZEBETA  Take 5 mg by mouth 2 (two) times daily.     cloNIDine 0.1 MG tablet  Commonly known as:  CATAPRES  Take 0.1 mg by mouth daily. For SBP>170     ergocalciferol 50000 UNITS capsule  Commonly known as:  VITAMIN D2  Take 50,000 Units by mouth every 30 (thirty) days.     famotidine 20 MG tablet  Commonly known as:  PEPCID  Take 20 mg by mouth at bedtime.     furosemide 20 MG tablet  Commonly known as:  LASIX  Take 1 tablet (20 mg total) by mouth daily.     guaiFENesin-dextromethorphan 100-10 MG/5ML syrup  Commonly known as:  ROBITUSSIN DM  Take 15 mLs by mouth 3 (three) times daily as needed. Cold symptoms     HYDROcodone-acetaminophen 5-325 MG per tablet  Commonly known as:  NORCO/VICODIN  Take one tablet by mouth every 6 hours as needed for pain     ipratropium-albuterol 0.5-2.5 (3) MG/3ML Soln  Commonly known as:  DUONEB  Take 3 mLs by nebulization every 6 (six) hours as needed.     loratadine 10 MG tablet  Commonly known as:  CLARITIN  Take 10 mg by mouth daily.     metoprolol 50 MG  tablet  Commonly known as:  LOPRESSOR  Take 1 tablet (50 mg total) by mouth 2 (two) times daily.     multivitamin capsule  Take 1 capsule by mouth daily.     nitroGLYCERIN 0.4 MG SL tablet  Commonly known as:  NITROSTAT  Place 0.4 mg under the tongue every 5 (five) minutes as needed for chest pain.     omeprazole 40 MG capsule  Commonly known as:  PRILOSEC  Take 40 mg by mouth daily.     potassium chloride SA 20 MEQ tablet  Commonly known as:  K-DUR,KLOR-CON  Take 1 tablet (20 mEq total) by mouth daily.     PredniSONE 10 MG Kit  Take 1 kit by mouth daily.     sennosides-docusate sodium 8.6-50 MG tablet  Commonly known as:  SENOKOT-S  Take 1 tablet by mouth daily as needed for constipation.     sertraline 50 MG tablet  Commonly known as:  ZOLOFT  Take 50 mg by mouth daily.     SIMBRINZA 1-0.2 % Susp  Generic drug:  Brinzolamide-Brimonidine  Apply 1 drop to eye 2  (two) times daily.     simvastatin 5 MG tablet  Commonly known as:  ZOCOR  Take 5 mg by mouth daily.        No orders of the defined types were placed in this encounter.     There is no immunization history on file for this patient.  History  Substance Use Topics  . Smoking status: Never Smoker   . Smokeless tobacco: Never Used  . Alcohol Use: No    Review of Systems  DATA OBTAINED: from patient; no c/o GENERAL:  no fevers, fatigue, appetite changes SKIN: No itching, rash HEENT: No complaint RESPIRATORY: No cough, wheezing, SOB CARDIAC: No chest pain, palpitations, lower extremity edema  GI: No abdominal pain, No N/V/D or constipation, No heartburn or reflux  GU: No dysuria, frequency or urgency, or incontinence  MUSCULOSKELETAL: No unrelieved bone/joint pain NEUROLOGIC: No headache, dizziness  PSYCHIATRIC: No overt anxiety or sadness  Filed Vitals:   02/24/14 1510  BP: 142/77  Pulse: 80  Temp: 98 F (36.7 C)  Resp: 20    Physical Exam  GENERAL APPEARANCE: Alert, modconversant, No acute distress  SKIN: No diaphoresis rash HEENT: Unremarkable RESPIRATORY: Breathing is even, unlabored. Lung sounds are clear   CARDIOVASCULAR: Heart RRR no murmurs, rubs or gallops. No peripheral edema  GASTROINTESTINAL: Abdomen is soft, non-tender, not distended w/ normal bowel sounds.  GENITOURINARY: Bladder non tender, not distended  MUSCULOSKELETAL: No abnormal joints or musculature NEUROLOGIC: Cranial nerves 2-12 grossly intact. PSYCHIATRIC: Mood and affect appropriate to situation, no behavioral issues  Patient Active Problem List   Diagnosis Date Noted  . Oropharyngeal dysphagia 02/28/2014  . CKD (chronic kidney disease) stage 1, GFR 90 ml/min or greater 02/28/2014  . Aspiration pneumonia 12/19/2013  . Decubitus ulcer of buttock 12/19/2013  . Dementia without behavioral disturbance 12/19/2013  . GERD (gastroesophageal reflux disease)   . Acute respiratory failure  11/25/2013  . Asthma with acute exacerbation 11/25/2013  . HCAP (healthcare-associated pneumonia) 11/25/2013  . Cough variant asthma 09/20/2013  . Pure hypercholesterolemia 12/09/2012  . Toxic metabolic encephalopathy 28/76/8115  . UTI (lower urinary tract infection) 07/28/2011  . HTN (hypertension) 07/28/2011  . Chronic diastolic heart failure 72/62/0355  . Atrial fibrillation, chronic 07/28/2011  . Dehydration 07/28/2011  . Oral candidiasis 07/28/2011  . Increased intraocular pressure 07/28/2011    CBC    Component  Value Date/Time   WBC 5.3 07/29/2011 0555   RBC 3.95 07/29/2011 0555   HGB 10.7* 07/29/2011 0555   HCT 32.6* 07/29/2011 0555   PLT 204 07/29/2011 0555   MCV 82.5 07/29/2011 0555   LYMPHSABS 1.1 07/28/2011 1500   MONOABS 0.7 07/28/2011 1500   EOSABS 0.2 07/28/2011 1500   BASOSABS 0.0 07/28/2011 1500    CMP     Component Value Date/Time   NA 136 07/29/2011 0555   K 3.5 07/29/2011 0555   CL 100 07/29/2011 0555   CO2 27 07/29/2011 0555   GLUCOSE 107* 07/29/2011 0555   BUN 15 07/29/2011 0555   CREATININE 0.82 07/29/2011 0555   CALCIUM 9.4 07/29/2011 0555   PROT 7.9 07/28/2011 1500   ALBUMIN 3.7 07/28/2011 1500   AST 18 07/28/2011 1500   ALT 15 07/28/2011 1500   ALKPHOS 61 07/28/2011 1500   BILITOT 0.4 07/28/2011 1500   GFRNONAA 63* 07/29/2011 0555   GFRAA 73* 07/29/2011 0555    Assessment and Plan  HTN (hypertension) Covered with lasix, bisoprolol, prn clonidine  Chronic diastolic heart failure tx with lasix and bblocker; no recent exacerbations  Atrial fibrillation, chronic No change-bisoprolol for rate and ASA as prophylaxis  Cough variant asthma No reported problems  Oropharyngeal dysphagia pureed and thin liquids;is coax\ched at meals;constant risk for aspiration but no known recent problems  Pure hypercholesterolemia Zocor 5 mg with LDL 83 and HDL 31 in 01/19/2014 with nl LFTs  CKD (chronic kidney disease) stage 1, GFR 90 ml/min or greater GFR 96, CrCl  104 calc; BUN  9/Cr0.6    Hennie Duos, MD

## 2014-02-28 ENCOUNTER — Encounter: Payer: Self-pay | Admitting: Internal Medicine

## 2014-02-28 DIAGNOSIS — N181 Chronic kidney disease, stage 1: Secondary | ICD-10-CM | POA: Insufficient documentation

## 2014-02-28 DIAGNOSIS — R1312 Dysphagia, oropharyngeal phase: Secondary | ICD-10-CM | POA: Insufficient documentation

## 2014-02-28 NOTE — Assessment & Plan Note (Signed)
No reported problems 

## 2014-02-28 NOTE — Assessment & Plan Note (Signed)
Zocor 5 mg with LDL 83 and HDL 31 in 01/19/2014 with nl LFTs

## 2014-02-28 NOTE — Assessment & Plan Note (Signed)
pureed and thin liquids;is coax\ched at meals;constant risk for aspiration but no known recent problems

## 2014-02-28 NOTE — Assessment & Plan Note (Signed)
tx with lasix and bblocker; no recent exacerbations

## 2014-02-28 NOTE — Assessment & Plan Note (Signed)
GFR 96, CrCl  104 calc; BUN 9/Cr0.6

## 2014-02-28 NOTE — Assessment & Plan Note (Signed)
Covered with lasix, bisoprolol, prn clonidine

## 2014-02-28 NOTE — Assessment & Plan Note (Signed)
No change-bisoprolol for rate and ASA as prophylaxis

## 2014-06-09 ENCOUNTER — Non-Acute Institutional Stay (SKILLED_NURSING_FACILITY): Payer: Medicare Other | Admitting: Internal Medicine

## 2014-06-09 DIAGNOSIS — G4733 Obstructive sleep apnea (adult) (pediatric): Secondary | ICD-10-CM

## 2014-06-09 DIAGNOSIS — F0390 Unspecified dementia without behavioral disturbance: Secondary | ICD-10-CM

## 2014-06-09 DIAGNOSIS — M150 Primary generalized (osteo)arthritis: Secondary | ICD-10-CM

## 2014-06-09 DIAGNOSIS — Z9989 Dependence on other enabling machines and devices: Secondary | ICD-10-CM

## 2014-06-09 DIAGNOSIS — IMO0001 Reserved for inherently not codable concepts without codable children: Secondary | ICD-10-CM

## 2014-06-09 DIAGNOSIS — I10 Essential (primary) hypertension: Secondary | ICD-10-CM

## 2014-06-09 DIAGNOSIS — M159 Polyosteoarthritis, unspecified: Secondary | ICD-10-CM

## 2014-06-09 DIAGNOSIS — G4440 Drug-induced headache, not elsewhere classified, not intractable: Secondary | ICD-10-CM

## 2014-06-09 DIAGNOSIS — R1312 Dysphagia, oropharyngeal phase: Secondary | ICD-10-CM

## 2014-06-09 DIAGNOSIS — B370 Candidal stomatitis: Secondary | ICD-10-CM

## 2014-06-09 DIAGNOSIS — F330 Major depressive disorder, recurrent, mild: Secondary | ICD-10-CM

## 2014-06-09 DIAGNOSIS — N390 Urinary tract infection, site not specified: Secondary | ICD-10-CM

## 2014-06-09 DIAGNOSIS — K219 Gastro-esophageal reflux disease without esophagitis: Secondary | ICD-10-CM

## 2014-06-09 NOTE — Progress Notes (Signed)
MRN: [id number] Name: Shelby Maynard  Sex: female Age: 81 y.o. DOB: 10-19-1922  [place] #: [facility] Facility/Room:421 Level Of Care: SNF Provider: Inocencio Homes D Emergency Contacts: Extended Emergency Contact Information Primary Emergency Contact: Everlean Patterson States of [place] Phone: [phone] Mobile Phone: [phone] Relation: Daughter Secondary Emergency Contact: Trevathan,Jackie Address: [address]           [place], [place] [address] [place] Phone: [phone] Mobile Phone: [phone] Relation: Daughter  Code Status: FULL  Allergies: Review of patient's allergies indicates no known allergies.  Chief Complaint  Patient presents with  . Medical Management of Chronic Issues    HPI: Patient is 81 y.o. female who is being seen for routine issues.  Past Medical History  Diagnosis Date  . Hypertension   . Atrial fibrillation   . Renal disorder   . Arthritis   . CHF (congestive heart failure)   . GERD (gastroesophageal reflux disease)   . Major depressive disorder, recurrent episode, mild degree 06/13/2014    Past Surgical History  Procedure Laterality Date  . Replacement total knee    . Total hip arthroplasty      bilateral  . Eye surgery    . Abdominal hysterectomy    . Joint replacement        Medication List       This list is accurate as of: 06/09/14 11:59 PM.  Always use your most recent med list.               acetaminophen 325 MG tablet  Commonly known as:  TYLENOL  Take 650 mg by mouth every 6 (six) hours as needed. Pain     albuterol (2.5 MG/3ML) 0.083% nebulizer solution  Commonly known as:  PROVENTIL  Take 2.5 mg by nebulization every 6 (six) hours as needed. wheezing     ARTIFICIAL TEARS 0.1-0.3 % Soln  Apply 1 drop to eye 3 (three) times daily.     aspirin 325 MG tablet  Take 325 mg by mouth daily.     bimatoprost 0.03 % ophthalmic solution  Commonly known as:  LUMIGAN  Place 1 drop into  both eyes at bedtime.     bisoprolol 5 MG tablet  Commonly known as:  ZEBETA  Take 5 mg by mouth 2 (two) times daily.     cloNIDine 0.1 MG tablet  Commonly known as:  CATAPRES  Take 0.1 mg by mouth daily. For SBP>170     ergocalciferol 50000 UNITS capsule  Commonly known as:  VITAMIN D2  Take 50,000 Units by mouth every 30 (thirty) days.     famotidine 20 MG tablet  Commonly known as:  PEPCID  Take 20 mg by mouth at bedtime.     furosemide 20 MG tablet  Commonly known as:  LASIX  Take 1 tablet (20 mg total) by mouth daily.     guaiFENesin-dextromethorphan 100-10 MG/5ML syrup  Commonly known as:  ROBITUSSIN DM  Take 15 mLs by mouth 3 (three) times daily as needed. Cold symptoms     HYDROcodone-acetaminophen 5-325 MG per tablet  Commonly known as:  NORCO/VICODIN  Take one tablet by mouth every 6 hours as needed for pain     ipratropium-albuterol 0.5-2.5 (3) MG/3ML Soln  Commonly known as:  DUONEB  Take 3 mLs by nebulization every 6 (six) hours as needed.     loratadine 10 MG tablet  Commonly known as:  CLARITIN  Take 10 mg by mouth  daily.     metoprolol 50 MG tablet  Commonly known as:  LOPRESSOR  Take 1 tablet (50 mg total) by mouth 2 (two) times daily.     multivitamin capsule  Take 1 capsule by mouth daily.     nitroGLYCERIN 0.4 MG SL tablet  Commonly known as:  NITROSTAT  Place 0.4 mg under the tongue every 5 (five) minutes as needed for chest pain.     omeprazole 40 MG capsule  Commonly known as:  PRILOSEC  Take 40 mg by mouth daily.     potassium chloride SA 20 MEQ tablet  Commonly known as:  K-DUR,KLOR-CON  Take 1 tablet (20 mEq total) by mouth daily.     PredniSONE 10 MG Kit  Take 1 kit by mouth daily.     sennosides-docusate sodium 8.6-50 MG tablet  Commonly known as:  SENOKOT-S  Take 1 tablet by mouth daily as needed for constipation.     sertraline 50 MG tablet  Commonly known as:  ZOLOFT  Take 25 mg by mouth daily.     SIMBRINZA 1-0.2 %  Susp  Generic drug:  Brinzolamide-Brimonidine  Apply 1 drop to eye 2 (two) times daily.        No orders of the defined types were placed in this encounter.     There is no immunization history on file for this patient.  History  Substance Use Topics  . Smoking status: Never Smoker   . Smokeless tobacco: Never Used  . Alcohol Use: No    Review of Systems  DATA OBTAINED: from patient; no c/o but pt does have dementia GENERAL:  no fevers, fatigue, appetite changes SKIN: No itching, rash HEENT: No complaint RESPIRATORY: No cough, wheezing, SOB CARDIAC: No chest pain, palpitations, lower extremity edema  GI: No abdominal pain, No N/V/D or constipation, No heartburn or reflux  GU: No dysuria, frequency or urgency, or incontinence  MUSCULOSKELETAL: No unrelieved bone/joint pain NEUROLOGIC: No headache, dizziness  PSYCHIATRIC: No overt anxiety or sadness  Filed Vitals:   06/09/14 1307  BP: 137/68  Pulse: 74  Temp: 97.9 F (36.6 C)  Resp: 22    Physical Exam  GENERAL APPEARANCE: Alert, minconversant, No acute distress  SKIN: No diaphoresis rash, or wounds HEENT: Unremarkable RESPIRATORY: Breathing is even, unlabored. Lung sounds are clear   CARDIOVASCULAR: Heart RRR no murmurs, rubs or gallops. No peripheral edema  GASTROINTESTINAL: Abdomen is soft, non-tender, not distended w/ normal bowel sounds.  GENITOURINARY: Bladder non tender, not distended  MUSCULOSKELETAL: No abnormal joints or musculature NEUROLOGIC: Cranial nerves 2-12 grossly intact PSYCHIATRIC: dementia, no behavioral issues  Patient Active Problem List   Diagnosis Date Noted  . Headache 06/13/2014  . Obesity, Class II, BMI 35-39.9, with comorbidity 06/13/2014  . OSA on CPAP 06/13/2014  . Major depressive disorder, recurrent episode, mild degree 06/13/2014  . Osteoarthritis of multiple joints 06/13/2014  . Dysphagia, oropharyngeal 06/13/2014  . Oropharyngeal dysphagia 02/28/2014  . CKD (chronic  kidney disease) stage 1, GFR 90 ml/min or greater 02/28/2014  . Aspiration pneumonia 12/19/2013  . Decubitus ulcer of buttock 12/19/2013  . Dementia without behavioral disturbance 12/19/2013  . GERD (gastroesophageal reflux disease)   . Acute respiratory failure 11/25/2013  . Asthma with acute exacerbation 11/25/2013  . HCAP (healthcare-associated pneumonia) 11/25/2013  . Cough variant asthma 09/20/2013  . Pure hypercholesterolemia 12/09/2012  . Toxic metabolic encephalopathy 76/16/0737  . UTI (lower urinary tract infection) 07/28/2011  . HTN (hypertension) 07/28/2011  . Chronic diastolic heart  failure 07/28/2011  . Paroxysmal atrial fibrillation 07/28/2011  . Dehydration 07/28/2011  . Oral candidiasis 07/28/2011  . Increased intraocular pressure 07/28/2011    CBC    Component Value Date/Time   WBC 5.3 07/29/2011 0555   RBC 3.95 07/29/2011 0555   HGB 10.7* 07/29/2011 0555   HCT 32.6* 07/29/2011 0555   PLT 204 07/29/2011 0555   MCV 82.5 07/29/2011 0555   LYMPHSABS 1.1 07/28/2011 1500   MONOABS 0.7 07/28/2011 1500   EOSABS 0.2 07/28/2011 1500   BASOSABS 0.0 07/28/2011 1500    CMP     Component Value Date/Time   NA 136 07/29/2011 0555   K 3.5 07/29/2011 0555   CL 100 07/29/2011 0555   CO2 27 07/29/2011 0555   GLUCOSE 107* 07/29/2011 0555   BUN 15 07/29/2011 0555   CREATININE 0.82 07/29/2011 0555   CALCIUM 9.4 07/29/2011 0555   PROT 7.9 07/28/2011 1500   ALBUMIN 3.7 07/28/2011 1500   AST 18 07/28/2011 1500   ALT 15 07/28/2011 1500   ALKPHOS 61 07/28/2011 1500   BILITOT 0.4 07/28/2011 1500   GFRNONAA 63* 07/29/2011 0555   GFRAA 73* 07/29/2011 0555    Assessment and Plan  UTI (lower urinary tract infection) Mid Dec pt with new hallucination of spiders on her arm and spider people speaking to her.U/A was + for >100,000 gram neg rods.pt was treated with Cipro 500 mg BID for 7 days and probiotic for 10 days. SX resolved   Headache End of December pt had several  days of HA with one episode of vomiting , decreased po intake and stating to family she was ready to move on. [facility] tx her with 3 days rocephin and obtained CT head which was NAD. Psych had started pt on pristiq 4 weeks prior and HA felt to be SE and since no improvement in mood in 4 weeks, drug was d/c and sx resolved   HTN (hypertension) Cont zebeta and lasix with prn clonidine   Obesity, Class II, BMI 35-39.9, with comorbidity BMI 35.7, weight has been stable.Dementia makes dietary compliance difficult   OSA on CPAP Pt uses CPAP but is non compliant despite teaching. She takes it off at night.Pt with dementia. Will continue to work with pt   Major depressive disorder, recurrent episode, mild degree Stable even with decrease in zoloft to 25 mg qHS.   Osteoarthritis of multiple joints Pt has prn tylenol and prn norco but has pain infrequently    Dementia without behavioral disturbance Pt has not had any precipitous declines.Continues on no dementia specific meds 2/2 RP desires.   GERD (gastroesophageal reflux disease) Pt with dysphagia, at high risk for aspiration if she refluxes;therefore dual therapy of PPI and pepcid without recent problems   Dysphagia, oropharyngeal Chronic and stable on pureed diet and thin liquids;no recent episodes, pt is eval by ST and is full supervision during meals     Hennie Duos, MD

## 2014-06-13 ENCOUNTER — Encounter: Payer: Self-pay | Admitting: Internal Medicine

## 2014-06-13 DIAGNOSIS — M159 Polyosteoarthritis, unspecified: Secondary | ICD-10-CM | POA: Insufficient documentation

## 2014-06-13 DIAGNOSIS — R519 Headache, unspecified: Secondary | ICD-10-CM | POA: Insufficient documentation

## 2014-06-13 DIAGNOSIS — R51 Headache: Secondary | ICD-10-CM

## 2014-06-13 DIAGNOSIS — G4733 Obstructive sleep apnea (adult) (pediatric): Secondary | ICD-10-CM | POA: Insufficient documentation

## 2014-06-13 DIAGNOSIS — R1312 Dysphagia, oropharyngeal phase: Secondary | ICD-10-CM | POA: Insufficient documentation

## 2014-06-13 DIAGNOSIS — F330 Major depressive disorder, recurrent, mild: Secondary | ICD-10-CM | POA: Insufficient documentation

## 2014-06-13 DIAGNOSIS — IMO0001 Reserved for inherently not codable concepts without codable children: Secondary | ICD-10-CM | POA: Insufficient documentation

## 2014-06-13 DIAGNOSIS — Z9989 Dependence on other enabling machines and devices: Secondary | ICD-10-CM

## 2014-06-13 HISTORY — DX: Major depressive disorder, recurrent, mild: F33.0

## 2014-06-13 NOTE — Assessment & Plan Note (Signed)
BMI 35.7, weight has been stable.Dementia makes dietary compliance difficult

## 2014-06-13 NOTE — Assessment & Plan Note (Signed)
Cont zebeta and lasix with prn clonidine

## 2014-06-13 NOTE — Assessment & Plan Note (Signed)
Pt has prn tylenol and prn norco but has pain infrequently

## 2014-06-13 NOTE — Assessment & Plan Note (Signed)
Pt has not had any precipitous declines.Continues on no dementia specific meds 2/2 RP desires.

## 2014-06-13 NOTE — Assessment & Plan Note (Signed)
Mid Dec pt with new hallucination of spiders on her arm and spider people speaking to her.U/A was + for >100,000 gram neg rods.pt was treated with Cipro 500 mg BID for 7 days and probiotic for 10 days. SX resolved

## 2014-06-13 NOTE — Assessment & Plan Note (Signed)
Stable even with decrease in zoloft to 25 mg qHS.

## 2014-06-13 NOTE — Assessment & Plan Note (Signed)
Pt uses CPAP but is non compliant despite teaching. She takes it off at night.Pt with dementia. Will continue to work with pt

## 2014-06-13 NOTE — Assessment & Plan Note (Signed)
Pt with dysphagia, at high risk for aspiration if she refluxes;therefore dual therapy of PPI and pepcid without recent problems

## 2014-06-13 NOTE — Assessment & Plan Note (Addendum)
End of December pt had several days of HA with one episode of vomiting , decreased po intake and stating to family she was ready to move on. [facility] tx her with 3 days rocephin and obtained CT head which was NAD. Psych had started pt on pristiq 4 weeks prior and HA felt to be SE and since no improvement in mood in 4 weeks, drug was d/c and sx resolved

## 2014-06-13 NOTE — Assessment & Plan Note (Signed)
Chronic and stable on pureed diet and thin liquids;no recent episodes, pt is eval by ST and is full supervision during meals

## 2014-07-27 LAB — HEPATIC FUNCTION PANEL
ALT: 7 U/L (ref 7–35)
AST: 12 U/L — AB (ref 13–35)
Alkaline Phosphatase: 54 U/L (ref 25–125)
Bilirubin, Total: 0.3 mg/dL

## 2014-07-27 LAB — LIPID PANEL
CHOLESTEROL: 122 mg/dL (ref 0–200)
HDL: 30 mg/dL — AB (ref 35–70)
LDL Cholesterol: 57 mg/dL
TRIGLYCERIDES: 174 mg/dL — AB (ref 40–160)

## 2014-07-27 LAB — TSH: TSH: 0.7 u[IU]/mL (ref <=5.90)

## 2014-07-30 ENCOUNTER — Non-Acute Institutional Stay (SKILLED_NURSING_FACILITY): Payer: Medicare Other | Admitting: Internal Medicine

## 2014-07-30 DIAGNOSIS — F01518 Vascular dementia, unspecified severity, with other behavioral disturbance: Secondary | ICD-10-CM

## 2014-07-30 DIAGNOSIS — F330 Major depressive disorder, recurrent, mild: Secondary | ICD-10-CM | POA: Diagnosis not present

## 2014-07-30 DIAGNOSIS — F28 Other psychotic disorder not due to a substance or known physiological condition: Secondary | ICD-10-CM

## 2014-07-30 DIAGNOSIS — R1312 Dysphagia, oropharyngeal phase: Secondary | ICD-10-CM | POA: Diagnosis not present

## 2014-07-30 DIAGNOSIS — F0151 Vascular dementia with behavioral disturbance: Secondary | ICD-10-CM

## 2014-07-30 DIAGNOSIS — J3089 Other allergic rhinitis: Secondary | ICD-10-CM

## 2014-07-30 DIAGNOSIS — G4733 Obstructive sleep apnea (adult) (pediatric): Secondary | ICD-10-CM

## 2014-07-30 DIAGNOSIS — IMO0001 Reserved for inherently not codable concepts without codable children: Secondary | ICD-10-CM

## 2014-07-30 DIAGNOSIS — K219 Gastro-esophageal reflux disease without esophagitis: Secondary | ICD-10-CM

## 2014-07-30 DIAGNOSIS — Z9989 Dependence on other enabling machines and devices: Secondary | ICD-10-CM

## 2014-07-30 DIAGNOSIS — N390 Urinary tract infection, site not specified: Secondary | ICD-10-CM

## 2014-07-31 ENCOUNTER — Encounter: Payer: Self-pay | Admitting: Internal Medicine

## 2014-07-31 NOTE — Progress Notes (Signed)
MRN: [id number] Name: Shelby Maynard  Sex: female Age: 81 y.o. DOB: December 02, 1922  [place] #: [facility] Facility/Room:421 Level Of Care: SNF Provider: Inocencio Homes D Emergency Contacts: Extended Emergency Contact Information Primary Emergency Contact: Everlean Patterson States of [place] Phone: [phone] Mobile Phone: [phone] Relation: Daughter Secondary Emergency Contact: Pennock,Jackie Address: [address]           [place], [place] [address] [place] Phone: [phone] Mobile Phone: [phone] Relation: Daughter  Code Status:FULL   Allergies: Review of patient's allergies indicates no known allergies.  Chief Complaint  Patient presents with  . Medical Management of Chronic Issues    HPI: Patient is 81 y.o. female who is being seen for routine issues.   Past Medical History  Diagnosis Date  . Hypertension   . Atrial fibrillation   . Renal disorder   . Arthritis   . CHF (congestive heart failure)   . GERD (gastroesophageal reflux disease)   . Major depressive disorder, recurrent episode, mild degree 06/13/2014    Past Surgical History  Procedure Laterality Date  . Replacement total knee    . Total hip arthroplasty      bilateral  . Eye surgery    . Abdominal hysterectomy    . Joint replacement        Medication List       This list is accurate as of: 07/30/14 11:59 PM.  Always use your most recent med list.               acetaminophen 325 MG tablet  Commonly known as:  TYLENOL  Take 650 mg by mouth every 6 (six) hours as needed. Pain     albuterol (2.5 MG/3ML) 0.083% nebulizer solution  Commonly known as:  PROVENTIL  Take 2.5 mg by nebulization every 6 (six) hours as needed. wheezing     ARTIFICIAL TEARS 0.1-0.3 % Soln  Apply 1 drop to eye 3 (three) times daily.     aspirin 325 MG tablet  Take 325 mg by mouth daily.     bimatoprost 0.03 % ophthalmic solution  Commonly known as:  LUMIGAN  Place 1 drop into  both eyes at bedtime.     bisoprolol 5 MG tablet  Commonly known as:  ZEBETA  Take 5 mg by mouth 2 (two) times daily.     cloNIDine 0.1 MG tablet  Commonly known as:  CATAPRES  Take 0.1 mg by mouth daily. For SBP>170     ergocalciferol 50000 UNITS capsule  Commonly known as:  VITAMIN D2  Take 50,000 Units by mouth every 30 (thirty) days.     famotidine 20 MG tablet  Commonly known as:  PEPCID  Take 20 mg by mouth daily.     furosemide 20 MG tablet  Commonly known as:  LASIX  Take 1 tablet (20 mg total) by mouth daily.     guaiFENesin-dextromethorphan 100-10 MG/5ML syrup  Commonly known as:  ROBITUSSIN DM  Take 15 mLs by mouth 3 (three) times daily as needed. Cold symptoms     HYDROcodone-acetaminophen 5-325 MG per tablet  Commonly known as:  NORCO/VICODIN  Take one tablet by mouth every 6 hours as needed for pain     ipratropium-albuterol 0.5-2.5 (3) MG/3ML Soln  Commonly known as:  DUONEB  Take 3 mLs by nebulization every 6 (six) hours as needed.     loratadine 10 MG tablet  Commonly known as:  CLARITIN  Take 10 mg by mouth  daily.     metoprolol 50 MG tablet  Commonly known as:  LOPRESSOR  Take 1 tablet (50 mg total) by mouth 2 (two) times daily.     multivitamin capsule  Take 1 capsule by mouth daily.     nitroGLYCERIN 0.4 MG SL tablet  Commonly known as:  NITROSTAT  Place 0.4 mg under the tongue every 5 (five) minutes as needed for chest pain.     omeprazole 40 MG capsule  Commonly known as:  PRILOSEC  Take 40 mg by mouth daily.     potassium chloride SA 20 MEQ tablet  Commonly known as:  K-DUR,KLOR-CON  Take 1 tablet (20 mEq total) by mouth daily.     PredniSONE 10 MG Kit  Take 1 kit by mouth daily.     sennosides-docusate sodium 8.6-50 MG tablet  Commonly known as:  SENOKOT-S  Take 1 tablet by mouth daily as needed for constipation.     sertraline 50 MG tablet  Commonly known as:  ZOLOFT  Take 25 mg by mouth daily.     SIMBRINZA 1-0.2 % Susp   Generic drug:  Brinzolamide-Brimonidine  Apply 1 drop to eye 2 (two) times daily.        Meds ordered this encounter  Medications  . DISCONTD: pantoprazole (PROTONIX) 40 MG tablet    Sig: Take 40 mg by mouth daily.  Marland Kitchen omeprazole (PRILOSEC) 40 MG capsule    Sig: Take 40 mg by mouth daily.  . famotidine (PEPCID) 20 MG tablet    Sig: Take 20 mg by mouth daily.     There is no immunization history on file for this patient.  History  Substance Use Topics  . Smoking status: Never Smoker   . Smokeless tobacco: Never Used  . Alcohol Use: No    Review of Systems   UTO from pt 2/2 dementia;per nursing no concerns    Filed Vitals:   07/30/14 1845  BP: 147/80  Pulse: 70  Temp: 98.3 F (36.8 C)  Resp: 18    Physical Exam  GENERAL APPEARANCE: Alert, No acute distress  SKIN: No diaphoresis rash HEENT: Unremarkable RESPIRATORY: Breathing is even, unlabored. Lung sounds are clear   CARDIOVASCULAR: Heart RRR no murmurs, rubs or gallops. No peripheral edema  GASTROINTESTINAL: Abdomen is soft, non-tender, not distended w/ normal bowel sounds.  GENITOURINARY: Bladder non tender, not distended  MUSCULOSKELETAL: No abnormal joints or musculature NEUROLOGIC: Cranial nerves 2-12 grossly intact. PSYCHIATRIC: dementia with no behavioral issues today  Patient Active Problem List   Diagnosis Date Noted  . Psychosis 08/01/2014  . AR (allergic rhinitis) 08/01/2014  . Headache 06/13/2014  . Obesity, Class II, BMI 35-39.9, with comorbidity 06/13/2014  . OSA on CPAP 06/13/2014  . Major depressive disorder, recurrent episode, mild degree 06/13/2014  . Osteoarthritis of multiple joints 06/13/2014  . Dysphagia, oropharyngeal 06/13/2014  . Oropharyngeal dysphagia 02/28/2014  . CKD (chronic kidney disease) stage 1, GFR 90 ml/min or greater 02/28/2014  . Aspiration pneumonia 12/19/2013  . Decubitus ulcer of buttock 12/19/2013  . Vascular dementia with behavioral disturbance 12/19/2013  .  GERD (gastroesophageal reflux disease)   . Acute respiratory failure 11/25/2013  . Asthma with acute exacerbation 11/25/2013  . HCAP (healthcare-associated pneumonia) 11/25/2013  . Cough variant asthma 09/20/2013  . Pure hypercholesterolemia 12/09/2012  . Toxic metabolic encephalopathy 13/12/6576  . UTI (lower urinary tract infection) 07/28/2011  . HTN (hypertension) 07/28/2011  . Chronic diastolic heart failure 46/96/2952  . Paroxysmal atrial fibrillation 07/28/2011  .  Dehydration 07/28/2011  . Oral candidiasis 07/28/2011  . Increased intraocular pressure 07/28/2011    CBC    Component Value Date/Time   WBC 5.3 07/29/2011 0555   RBC 3.95 07/29/2011 0555   HGB 10.7* 07/29/2011 0555   HCT 32.6* 07/29/2011 0555   PLT 204 07/29/2011 0555   MCV 82.5 07/29/2011 0555   LYMPHSABS 1.1 07/28/2011 1500   MONOABS 0.7 07/28/2011 1500   EOSABS 0.2 07/28/2011 1500   BASOSABS 0.0 07/28/2011 1500    CMP     Component Value Date/Time   NA 136 07/29/2011 0555   K 3.5 07/29/2011 0555   CL 100 07/29/2011 0555   CO2 27 07/29/2011 0555   GLUCOSE 107* 07/29/2011 0555   BUN 15 07/29/2011 0555   CREATININE 0.82 07/29/2011 0555   CALCIUM 9.4 07/29/2011 0555   PROT 7.9 07/28/2011 1500   ALBUMIN 3.7 07/28/2011 1500   AST 18 07/28/2011 1500   ALT 15 07/28/2011 1500   ALKPHOS 61 07/28/2011 1500   BILITOT 0.4 07/28/2011 1500   GFRNONAA 63* 07/29/2011 0555   GFRAA 73* 07/29/2011 0555    Assessment and Plan  Psychosis Dur to progressive dementia id s chronic and ongoing with recent tritrations of risperdol from 0.25 to 0.50 mg q HS.Aricept 5 mg started as well and fewer behavoirs with these additions. Expect to inc aricept to 10 mg and monitor.    Oropharyngeal dysphagia Chronic and stable and without recent sx of aspiration;Plan-continue monitoring at meals and ST if pt has difficulty tolerating dysphagia diet   UTI (lower urinary tract infection) Recent UTI 2/22 with > 1000,000 E Coli  ESBL. Sensitive to Augmentin and treated for 7 days with Augmentin 875 mg BID with good recovery.   Major depressive disorder, recurrent episode, mild degree Chronic and stable on current dose of zoloft. Plan - continue Zoloft 25 mg qHS   OSA on CPAP Chronic and ongoing with no recent documentation by nursing of non compliance; Plan - continue nightly CPap as per pulmonology Dr Verdie Mosher   Vascular dementia with behavioral disturbance Pt was started on aricept in effort ot improve behavoirs without any problems with side effect. Will continue aricept 5 mg and plan increase to 10 mg soon   GERD (gastroesophageal reflux disease) No sx of of uncontrolled reflux;Plan - continue omeprazole and pepcis AC   Protein-calorie malnutrition, moderate 2/2 advanced dementia am\nd inconsistent PO intake; still weight has remained stable for past 6 months; continue with diet supplements and monthly weights   Obesity, Class II, BMI 35-39.9, with comorbidity Chronic and stable;despite dietary controlled diet pt has not lost weight, but hasn't gained eithr;Plan - cont to monitor po intake and monthly weights   AR (allergic rhinitis) Current med controls sx of cough, sneezing, watery eyes;Plan - continue claritin 10 mg daily     Hennie Duos, MD

## 2014-08-01 ENCOUNTER — Encounter: Payer: Self-pay | Admitting: Internal Medicine

## 2014-08-01 DIAGNOSIS — M353 Polymyalgia rheumatica: Secondary | ICD-10-CM | POA: Insufficient documentation

## 2014-08-01 DIAGNOSIS — E440 Moderate protein-calorie malnutrition: Secondary | ICD-10-CM | POA: Insufficient documentation

## 2014-08-01 DIAGNOSIS — F29 Unspecified psychosis not due to a substance or known physiological condition: Secondary | ICD-10-CM | POA: Insufficient documentation

## 2014-08-01 DIAGNOSIS — J309 Allergic rhinitis, unspecified: Secondary | ICD-10-CM | POA: Insufficient documentation

## 2014-08-01 NOTE — Assessment & Plan Note (Signed)
Chronic and stable;despite dietary controlled diet pt has not lost weight, but hasn't gained eithr;Plan - cont to monitor po intake and monthly weights

## 2014-08-01 NOTE — Assessment & Plan Note (Signed)
Current med controls sx of cough, sneezing, watery eyes;Plan - continue claritin 10 mg daily

## 2014-08-01 NOTE — Assessment & Plan Note (Signed)
Recent UTI 2/22 with > 1000,000 E Coli ESBL. Sensitive to Augmentin and treated for 7 days with Augmentin 875 mg BID with good recovery.

## 2014-08-01 NOTE — Assessment & Plan Note (Signed)
Dur to progressive dementia id s chronic and ongoing with recent tritrations of risperdol from 0.25 to 0.50 mg q HS.Aricept 5 mg started as well and fewer behavoirs with these additions. Expect to inc aricept to 10 mg and monitor.

## 2014-08-01 NOTE — Assessment & Plan Note (Signed)
Chronic and stable and without recent sx of aspiration;Plan-continue monitoring at meals and ST if pt has difficulty tolerating dysphagia diet

## 2014-08-01 NOTE — Assessment & Plan Note (Signed)
Chronic and ongoing with no recent documentation by nursing of non compliance; Plan - continue nightly CPap as per pulmonology Dr Eulis FosterBeauford

## 2014-08-01 NOTE — Assessment & Plan Note (Addendum)
Chronic and stable on current dose of zoloft. Plan - continue Zoloft 25 mg qHS

## 2014-08-01 NOTE — Assessment & Plan Note (Signed)
Pt was started on aricept in effort ot improve behavoirs without any problems with side effect. Will continue aricept 5 mg and plan increase to 10 mg soon

## 2014-08-01 NOTE — Assessment & Plan Note (Addendum)
No sx of of uncontrolled reflux;Plan - continue omeprazole and pepcis AC

## 2014-08-01 NOTE — Assessment & Plan Note (Signed)
2/2 advanced dementia am\nd inconsistent PO intake; still weight has remained stable for past 6 months; continue with diet supplements and monthly weights

## 2014-08-30 LAB — BASIC METABOLIC PANEL
BUN: 8 mg/dL (ref 4–21)
CREATININE: 0.7 mg/dL (ref <=1.1)
Glucose: 88 mg/dL
Potassium: 4.1 mmol/L (ref 3.4–5.3)
Sodium: 141 mmol/L (ref 137–147)

## 2014-08-30 LAB — CBC AND DIFFERENTIAL
HCT: 36 % (ref 36–46)
Hemoglobin: 11.6 g/dL — AB (ref 12.0–16.0)
Platelets: 162 10*3/uL (ref 150–399)
WBC: 5.5 10*3/mL

## 2014-10-13 ENCOUNTER — Non-Acute Institutional Stay (SKILLED_NURSING_FACILITY): Payer: Medicare Other | Admitting: Internal Medicine

## 2014-10-13 ENCOUNTER — Encounter: Payer: Self-pay | Admitting: Internal Medicine

## 2014-10-13 DIAGNOSIS — N390 Urinary tract infection, site not specified: Secondary | ICD-10-CM | POA: Diagnosis not present

## 2014-10-13 DIAGNOSIS — F0151 Vascular dementia with behavioral disturbance: Secondary | ICD-10-CM

## 2014-10-13 DIAGNOSIS — F29 Unspecified psychosis not due to a substance or known physiological condition: Secondary | ICD-10-CM

## 2014-10-13 DIAGNOSIS — F01518 Vascular dementia, unspecified severity, with other behavioral disturbance: Secondary | ICD-10-CM

## 2014-10-13 NOTE — Progress Notes (Signed)
MRN: [id number] Name: Shelby Maynard  Sex: female Age: 81 y.o. DOB: 12-Sep-1922  [place] #: [facility] Facility/Room: Level Of Care: SNF Provider: Inocencio Homes D Emergency Contacts: Extended Emergency Contact Information Primary Emergency Contact: Everlean Patterson States of [place] Phone: [phone] Mobile Phone: [phone] Relation: Daughter Secondary Emergency Contact: Smethers,Jackie Address: [address]           [place], [place] [address] [place] Phone: [phone] Mobile Phone: [phone] Relation: Daughter  Code Status: FULL  Allergies: Review of patient's allergies indicates no known allergies.  Chief Complaint  Patient presents with  . Medical Management of Chronic Issues    HPI: Patient is 81 y.o. female who is being seen for routine issues.  Past Medical History  Diagnosis Date  . Hypertension   . Atrial fibrillation   . Renal disorder   . Arthritis   . CHF (congestive heart failure)   . GERD (gastroesophageal reflux disease)   . Major depressive disorder, recurrent episode, mild degree 06/13/2014    Past Surgical History  Procedure Laterality Date  . Replacement total knee    . Total hip arthroplasty      bilateral  . Eye surgery    . Abdominal hysterectomy    . Joint replacement        Medication List       This list is accurate as of: 10/13/14 11:59 PM.  Always use your most recent med list.               acetaminophen 325 MG tablet  Commonly known as:  TYLENOL  Take 650 mg by mouth every 6 (six) hours as needed. Pain     albuterol (2.5 MG/3ML) 0.083% nebulizer solution  Commonly known as:  PROVENTIL  Take 2.5 mg by nebulization every 6 (six) hours as needed. wheezing     ARTIFICIAL TEARS 0.1-0.3 % Soln  Apply 1 drop to eye 3 (three) times daily.     aspirin 325 MG tablet  Take 325 mg by mouth daily.     bimatoprost 0.03 % ophthalmic solution  Commonly known as:  LUMIGAN  Place 1 drop into  both eyes at bedtime. For glaucoma     bisoprolol 5 MG tablet  Commonly known as:  ZEBETA  Take 5 mg by mouth 2 (two) times daily.     cloNIDine 0.1 MG tablet  Commonly known as:  CATAPRES  Take 0.1 mg by mouth daily. For SBP>170     ergocalciferol 50000 UNITS capsule  Commonly known as:  VITAMIN D2  Take 50,000 Units by mouth every 30 (thirty) days.     famotidine 20 MG tablet  Commonly known as:  PEPCID  Take 20 mg by mouth daily. For GERD     furosemide 20 MG tablet  Commonly known as:  LASIX  Take 1 tablet (20 mg total) by mouth daily.     guaiFENesin-dextromethorphan 100-10 MG/5ML syrup  Commonly known as:  ROBITUSSIN DM  Take 15 mLs by mouth 3 (three) times daily as needed. Cold symptoms     HYDROcodone-acetaminophen 5-325 MG per tablet  Commonly known as:  NORCO/VICODIN  Take one tablet by mouth every 6 hours as needed for pain     ipratropium-albuterol 0.5-2.5 (3) MG/3ML Soln  Commonly known as:  DUONEB  Take 3 mLs by nebulization every 6 (six) hours as needed.     loratadine 10 MG tablet  Commonly known as:  CLARITIN  Take 10  mg by mouth daily. For allergic rhinitis     metoprolol 50 MG tablet  Commonly known as:  LOPRESSOR  Take 1 tablet (50 mg total) by mouth 2 (two) times daily.     multivitamin capsule  Take 1 capsule by mouth daily. For anemia     nitroGLYCERIN 0.4 MG SL tablet  Commonly known as:  NITROSTAT  Place 0.4 mg under the tongue every 5 (five) minutes as needed for chest pain.     omeprazole 40 MG capsule  Commonly known as:  PRILOSEC  Take 40 mg by mouth daily.     potassium chloride SA 20 MEQ tablet  Commonly known as:  K-DUR,KLOR-CON  Take 1 tablet (20 mEq total) by mouth daily.     PredniSONE 10 MG Kit  Take 1 kit by mouth daily.     sennosides-docusate sodium 8.6-50 MG tablet  Commonly known as:  SENOKOT-S  Take 1 tablet by mouth daily as needed for constipation.     sertraline 50 MG tablet  Commonly known as:  ZOLOFT   Take 25 mg by mouth daily. For depressive disorder     SIMBRINZA 1-0.2 % Susp  Generic drug:  Brinzolamide-Brimonidine  Apply 1 drop to eye 2 (two) times daily. For glaucoma        No orders of the defined types were placed in this encounter.    Immunization History  Administered Date(s) Administered  . PPD Test 05/23/2012    History  Substance Use Topics  . Smoking status: Never Smoker   . Smokeless tobacco: Never Used  . Alcohol Use: No    Review of Systems  DATA OBTAINED: from patient, nurse GENERAL:  no fevers, fatigue, appetite changes SKIN: No itching, rash HEENT: No complaint RESPIRATORY: No cough, wheezing, SOB CARDIAC: No chest pain, palpitations, lower extremity edema  GI: No abdominal pain, No N/V/D or constipation, No heartburn or reflux  GU: No dysuria, frequency or urgency, or incontinence  MUSCULOSKELETAL: No unrelieved bone/joint pain NEUROLOGIC: No headache, dizziness  PSYCHIATRIC: occ halluccinations  Filed Vitals:   10/13/14 2017  BP: 135/72  Pulse: 68  Temp: 97.1 F (36.2 C)  Resp: 22    Physical Exam  GENERAL APPEARANCE: Alert, conversant, No acute distress  SKIN: No diaphoresis rash, or wounds HEENT: Unremarkable RESPIRATORY: Breathing is even, unlabored. Lung sounds are clear   CARDIOVASCULAR: Heart RRR no murmurs, rubs or gallops. No peripheral edema  GASTROINTESTINAL: Abdomen is soft, non-tender, not distended w/ normal bowel sounds.  GENITOURINARY: Bladder non tender, not distended  MUSCULOSKELETAL: No abnormal joints or musculature NEUROLOGIC: Cranial nerves 2-12 grossly intact PSYCHIATRIC: oriented to self, pleasant talkative  Patient Active Problem List   Diagnosis Date Noted  . Psychosis 08/01/2014  . AR (allergic rhinitis) 08/01/2014  . Headache 06/13/2014  . Obesity, Class II, BMI 35-39.9, with comorbidity 06/13/2014  . OSA on CPAP 06/13/2014  . Major depressive disorder, recurrent episode, mild degree 06/13/2014  .  Osteoarthritis of multiple joints 06/13/2014  . Dysphagia, oropharyngeal 06/13/2014  . Oropharyngeal dysphagia 02/28/2014  . CKD (chronic kidney disease) stage 1, GFR 90 ml/min or greater 02/28/2014  . Aspiration pneumonia 12/19/2013  . Decubitus ulcer of buttock 12/19/2013  . Vascular dementia with behavioral disturbance 12/19/2013  . GERD (gastroesophageal reflux disease)   . Acute respiratory failure 11/25/2013  . Asthma with acute exacerbation 11/25/2013  . HCAP (healthcare-associated pneumonia) 11/25/2013  . Cough variant asthma 09/20/2013  . Pure hypercholesterolemia 12/09/2012  . Toxic metabolic encephalopathy 29/52/8413  .  UTI (lower urinary tract infection) 07/28/2011  . HTN (hypertension) 07/28/2011  . Chronic diastolic heart failure 58/59/2924  . Paroxysmal atrial fibrillation 07/28/2011  . Dehydration 07/28/2011  . Oral candidiasis 07/28/2011  . Increased intraocular pressure 07/28/2011    CBC    Component Value Date/Time   WBC 5.5 08/30/2014   WBC 5.3 07/29/2011 0555   RBC 3.95 07/29/2011 0555   HGB 11.6* 08/30/2014   HCT 36 08/30/2014   PLT 162 08/30/2014   MCV 82.5 07/29/2011 0555   LYMPHSABS 1.1 07/28/2011 1500   MONOABS 0.7 07/28/2011 1500   EOSABS 0.2 07/28/2011 1500   BASOSABS 0.0 07/28/2011 1500    CMP     Component Value Date/Time   NA 141 08/30/2014   NA 136 07/29/2011 0555   K 4.1 08/30/2014   CL 100 07/29/2011 0555   CO2 27 07/29/2011 0555   GLUCOSE 107* 07/29/2011 0555   BUN 8 08/30/2014   BUN 15 07/29/2011 0555   CREATININE 0.7 08/30/2014   CREATININE 0.82 07/29/2011 0555   CALCIUM 9.4 07/29/2011 0555   PROT 7.9 07/28/2011 1500   ALBUMIN 3.7 07/28/2011 1500   AST 12* 07/27/2014   ALT 7 07/27/2014   ALKPHOS 54 07/27/2014   BILITOT 0.4 07/28/2011 1500   GFRNONAA 63* 07/29/2011 0555   GFRAA 73* 07/29/2011 0555    Assessment and Plan  UTI (lower urinary tract infection) In early April pt had another UTI, this time Klebsiella and  E. Coli and treated with Augmentin 875 mg q 12 for 10 days.   Psychosis Chronic and progressing with dementia and exacerbation with UTI;Plan - continue risperdal and aricept   Vascular dementia with behavioral disturbance Pt has been continued on Aricept 5 mg for dementia with risperdal for behavoirs with occ exacerbations with hallucinations     Hennie Duos, MD

## 2014-10-14 ENCOUNTER — Encounter: Payer: Self-pay | Admitting: *Deleted

## 2014-10-17 ENCOUNTER — Encounter: Payer: Self-pay | Admitting: Internal Medicine

## 2014-10-17 NOTE — Assessment & Plan Note (Signed)
Chronic and progressing with dementia and exacerbation with UTI;Plan - continue risperdal and aricept

## 2014-10-17 NOTE — Assessment & Plan Note (Signed)
Pt has been continued on Aricept 5 mg for dementia with risperdal for behavoirs with occ exacerbations with hallucinations

## 2014-10-17 NOTE — Assessment & Plan Note (Signed)
In early April pt had another UTI, this time Klebsiella and E. Coli and treated with Augmentin 875 mg q 12 for 10 days.

## 2014-11-12 ENCOUNTER — Non-Acute Institutional Stay (SKILLED_NURSING_FACILITY): Payer: Medicare Other | Admitting: Internal Medicine

## 2014-11-12 ENCOUNTER — Encounter: Payer: Self-pay | Admitting: Internal Medicine

## 2014-11-12 DIAGNOSIS — F29 Unspecified psychosis not due to a substance or known physiological condition: Secondary | ICD-10-CM | POA: Diagnosis not present

## 2014-11-12 DIAGNOSIS — F0151 Vascular dementia with behavioral disturbance: Secondary | ICD-10-CM | POA: Diagnosis not present

## 2014-11-12 DIAGNOSIS — F330 Major depressive disorder, recurrent, mild: Secondary | ICD-10-CM

## 2014-11-12 DIAGNOSIS — F01518 Vascular dementia, unspecified severity, with other behavioral disturbance: Secondary | ICD-10-CM

## 2014-11-12 NOTE — Assessment & Plan Note (Signed)
Good appetite, stable weight, no new cognitive defect;plan - cont aricept and risperdol

## 2014-11-12 NOTE — Assessment & Plan Note (Signed)
No changes in mood or behavoir;Plan - cont zoloft 25 mg q hs

## 2014-11-12 NOTE — Assessment & Plan Note (Signed)
No new behavoirs/hallucinations; Plan- risperdal dec to 0.25 qHS

## 2014-11-12 NOTE — Progress Notes (Signed)
MRN: [id number] Name: Shelby Maynard  Sex: female Age: 81 y.o. DOB: 02/02/1923  [facility] #: [facility] Facility/Room: Level Of Care: SNF Provider: Merrilee Seashore D Emergency Contacts: Extended Emergency Contact Information Primary Emergency Contact: Foy [place] Home Phone: [phone] Mobile Phone: [phone] Relation: Daughter Secondary Emergency Contact: Chachere,Jackie Address: [address]           [place], [place] [address] [place] Home Phone: [phone] Mobile Phone: [phone] Relation: Daughter  Code Status: FULL  Allergies: Review of patient's allergies indicates no known allergies.  Chief Complaint  Patient presents with  . Medical Management of Chronic Issues    HPI: Patient is 81 y.o. female who is being seen for routine issues.  Past Medical History  Diagnosis Date  . Hypertension   . Atrial fibrillation   . Renal disorder   . Arthritis   . CHF (congestive heart failure)   . GERD (gastroesophageal reflux disease)   . Major depressive disorder, recurrent episode, mild degree 06/13/2014    Past Surgical History  Procedure Laterality Date  . Replacement total knee    . Total hip arthroplasty      bilateral  . Eye surgery    . Abdominal hysterectomy    . Joint replacement        Medication List       This list is accurate as of: 11/12/14  7:22 PM.  Always use your most recent med list.               acetaminophen 325 MG tablet  Commonly known as:  TYLENOL  Take 650 mg by mouth every 6 (six) hours as needed. Pain     ARTIFICIAL TEARS 0.1-0.3 % Soln  Apply 1 drop to eye 3 (three) times daily.     aspirin 325 MG tablet  Take 325 mg by mouth daily.     bimatoprost 0.03 % ophthalmic solution  Commonly known as:  LUMIGAN  Place 1 drop into both eyes at bedtime. For glaucoma     bisoprolol 5 MG tablet  Commonly known as:  ZEBETA  Take 5 mg by mouth 2 (two) times daily.     cloNIDine 0.1 MG  tablet  Commonly known as:  CATAPRES  Take 0.1 mg by mouth daily. For SBP>170     donepezil 5 MG tablet  Commonly known as:  ARICEPT  Take 5 mg by mouth at bedtime. For dementia     ergocalciferol 50000 UNITS capsule  Commonly known as:  VITAMIN D2  Take 50,000 Units by mouth every 30 (thirty) days.     famotidine 20 MG tablet  Commonly known as:  PEPCID  Take 20 mg by mouth daily. For GERD     furosemide 20 MG tablet  Commonly known as:  LASIX  Take 1 tablet (20 mg total) by mouth daily.     HYDROcodone-acetaminophen 5-325 MG per tablet  Commonly known as:  NORCO/VICODIN  Take one tablet by mouth every 6 hours as needed for pain     ipratropium-albuterol 0.5-2.5 (3) MG/3ML Soln  Commonly known as:  DUONEB  Take 3 mLs by nebulization every 6 (six) hours as needed.     loratadine 10 MG tablet  Commonly known as:  CLARITIN  Take 10 mg by mouth daily. For allergic rhinitis     metoprolol 50 MG tablet  Commonly known as:  LOPRESSOR  Take 1 tablet (50 mg total) by mouth 2 (two) times  daily.     multivitamin capsule  Take 1 capsule by mouth daily. For anemia     nitroGLYCERIN 0.4 MG SL tablet  Commonly known as:  NITROSTAT  Place 0.4 mg under the tongue every 5 (five) minutes as needed for chest pain.     omeprazole 40 MG capsule  Commonly known as:  PRILOSEC  Take 40 mg by mouth daily.     potassium chloride SA 20 MEQ tablet  Commonly known as:  K-DUR,KLOR-CON  Take 1 tablet (20 mEq total) by mouth daily.     risperiDONE 0.5 MG tablet  Commonly known as:  RISPERDAL  Take 0.5 mg by mouth at bedtime.     sennosides-docusate sodium 8.6-50 MG tablet  Commonly known as:  SENOKOT-S  Take 1 tablet by mouth daily as needed for constipation.     sertraline 50 MG tablet  Commonly known as:  ZOLOFT  Take 25 mg by mouth daily. For depressive disorder     SIMBRINZA 1-0.2 % Susp  Generic drug:  Brinzolamide-Brimonidine  Apply 1 drop to eye 2 (two) times daily. For  glaucoma        No orders of the defined types were placed in this encounter.    Immunization History  Administered Date(s) Administered  . PPD Test 05/23/2012    History  Substance Use Topics  . Smoking status: Never Smoker   . Smokeless tobacco: Never Used  . Alcohol Use: No    Review of Systems  DATA OBTAINED: from nurse GENERAL:  no fevers, fatigue, appetite changes; no concerns SKIN: No itching, rash HEENT: No complaint RESPIRATORY: No cough, wheezing, SOB CARDIAC: No chest pain, palpitations, lower extremity edema  GI: No abdominal pain, No N/V/D or constipation, No heartburn or reflux  GU: No dysuria, frequency or urgency, or incontinence  MUSCULOSKELETAL: No unrelieved bone/joint pain NEUROLOGIC: No headache, dizziness  PSYCHIATRIC: No overt anxiety or sadness  Filed Vitals:   11/12/14 1914  BP: 135/72  Pulse: 82  Temp: 97.1 F (36.2 C)  Resp: 20    Physical Exam  GENERAL APPEARANCE: Alert, BF, No acute distress  SKIN: No diaphoresis rash HEENT: Unremarkable RESPIRATORY: Breathing is even, unlabored. Lung sounds are clear   CARDIOVASCULAR: Heart RRR no murmurs, rubs or gallops. No peripheral edema  GASTROINTESTINAL: Abdomen is soft, non-tender, not distended w/ normal bowel sounds.  GENITOURINARY: Bladder non tender, not distended  MUSCULOSKELETAL: No abnormal joints or musculature NEUROLOGIC: Cranial nerves 2-12 grossly intact PSYCHIATRIC: dementia, no behavioral issues  Patient Active Problem List   Diagnosis Date Noted  . Psychosis 08/01/2014  . AR (allergic rhinitis) 08/01/2014  . Headache 06/13/2014  . Obesity, Class II, BMI 35-39.9, with comorbidity 06/13/2014  . OSA on CPAP 06/13/2014  . Major depressive disorder, recurrent episode, mild degree 06/13/2014  . Osteoarthritis of multiple joints 06/13/2014  . Dysphagia, oropharyngeal 06/13/2014  . Oropharyngeal dysphagia 02/28/2014  . CKD (chronic kidney disease) stage 1, GFR 90 ml/min or  greater 02/28/2014  . Aspiration pneumonia 12/19/2013  . Decubitus ulcer of buttock 12/19/2013  . Vascular dementia with behavioral disturbance 12/19/2013  . GERD (gastroesophageal reflux disease)   . Acute respiratory failure 11/25/2013  . Asthma with acute exacerbation 11/25/2013  . HCAP (healthcare-associated pneumonia) 11/25/2013  . Cough variant asthma 09/20/2013  . Pure hypercholesterolemia 12/09/2012  . Toxic metabolic encephalopathy 07/28/2011  . UTI (lower urinary tract infection) 07/28/2011  . HTN (hypertension) 07/28/2011  . Chronic diastolic heart failure 07/28/2011  . Paroxysmal atrial fibrillation  07/28/2011  . Dehydration 07/28/2011  . Oral candidiasis 07/28/2011  . Increased intraocular pressure 07/28/2011    CBC    Component Value Date/Time   WBC 5.5 08/30/2014   WBC 5.3 07/29/2011 0555   RBC 3.95 07/29/2011 0555   HGB 11.6* 08/30/2014   HCT 36 08/30/2014   PLT 162 08/30/2014   MCV 82.5 07/29/2011 0555   LYMPHSABS 1.1 07/28/2011 1500   MONOABS 0.7 07/28/2011 1500   EOSABS 0.2 07/28/2011 1500   BASOSABS 0.0 07/28/2011 1500    CMP     Component Value Date/Time   NA 141 08/30/2014   NA 136 07/29/2011 0555   K 4.1 08/30/2014   CL 100 07/29/2011 0555   CO2 27 07/29/2011 0555   GLUCOSE 107* 07/29/2011 0555   BUN 8 08/30/2014   BUN 15 07/29/2011 0555   CREATININE 0.7 08/30/2014   CREATININE 0.82 07/29/2011 0555   CALCIUM 9.4 07/29/2011 0555   PROT 7.9 07/28/2011 1500   ALBUMIN 3.7 07/28/2011 1500   AST 12* 07/27/2014   ALT 7 07/27/2014   ALKPHOS 54 07/27/2014   BILITOT 0.4 07/28/2011 1500   GFRNONAA 63* 07/29/2011 0555   GFRAA 73* 07/29/2011 0555    Assessment and Plan  Vascular dementia with behavioral disturbance Good appetite, stable weight, no new cognitive defect;plan - cont aricept and risperdol  Major depressive disorder, recurrent episode, mild degree No changes in mood or behavoir;Plan - cont zoloft 25 mg q hs  Psychosis No new  behavoirs/hallucinations; Plan- risperdal dec to 0.25 qHS    Margit Hanks, MD

## 2015-03-29 DEATH — deceased
